# Patient Record
Sex: Male | Born: 1980
Health system: Southern US, Community
[De-identification: ages and names within clinical notes are randomized; demographics above are authoritative.]

## PROBLEM LIST (undated history)

## (undated) DIAGNOSIS — K602 Anal fissure, unspecified: Secondary | ICD-10-CM

## (undated) DIAGNOSIS — K219 Gastro-esophageal reflux disease without esophagitis: Secondary | ICD-10-CM

## (undated) HISTORY — DX: Anal fissure, unspecified: K60.2

---

## 2005-03-17 ENCOUNTER — Ambulatory Visit: Payer: Self-pay | Admitting: Internal Medicine

## 2005-09-23 ENCOUNTER — Ambulatory Visit: Payer: Self-pay | Admitting: Internal Medicine

## 2006-07-02 ENCOUNTER — Ambulatory Visit: Payer: Self-pay | Admitting: Internal Medicine

## 2006-08-07 ENCOUNTER — Ambulatory Visit: Payer: Self-pay | Admitting: Internal Medicine

## 2006-08-11 ENCOUNTER — Ambulatory Visit: Payer: Self-pay | Admitting: Internal Medicine

## 2006-10-19 ENCOUNTER — Encounter: Admission: RE | Admit: 2006-10-19 | Discharge: 2006-10-19 | Payer: Self-pay | Admitting: Internal Medicine

## 2006-10-19 ENCOUNTER — Ambulatory Visit: Payer: Self-pay | Admitting: Internal Medicine

## 2006-11-10 HISTORY — PX: EXCISIONAL HEMORRHOIDECTOMY: SHX1541

## 2007-03-05 ENCOUNTER — Ambulatory Visit: Payer: Self-pay | Admitting: Internal Medicine

## 2007-09-06 ENCOUNTER — Ambulatory Visit: Payer: Self-pay | Admitting: Internal Medicine

## 2007-09-07 ENCOUNTER — Ambulatory Visit: Payer: Self-pay | Admitting: Internal Medicine

## 2007-09-07 ENCOUNTER — Encounter: Payer: Self-pay | Admitting: Internal Medicine

## 2007-09-17 ENCOUNTER — Encounter: Payer: Self-pay | Admitting: Internal Medicine

## 2007-09-17 DIAGNOSIS — K649 Unspecified hemorrhoids: Secondary | ICD-10-CM | POA: Insufficient documentation

## 2007-09-17 DIAGNOSIS — K602 Anal fissure, unspecified: Secondary | ICD-10-CM | POA: Insufficient documentation

## 2007-11-01 ENCOUNTER — Telehealth: Payer: Self-pay | Admitting: Internal Medicine

## 2007-11-02 ENCOUNTER — Ambulatory Visit: Payer: Self-pay | Admitting: Internal Medicine

## 2007-11-11 HISTORY — PX: ANAL FISSURE REPAIR: SHX2312

## 2007-12-05 IMAGING — CR DG CHEST 2V
2 series · 2 of 2 positions shown · non-contrast
Comparison: None.

CLINICAL DATA: Cough, congestion, and fever. 
CHEST ? 2 VIEW:

[view not recorded (1 of 2)]
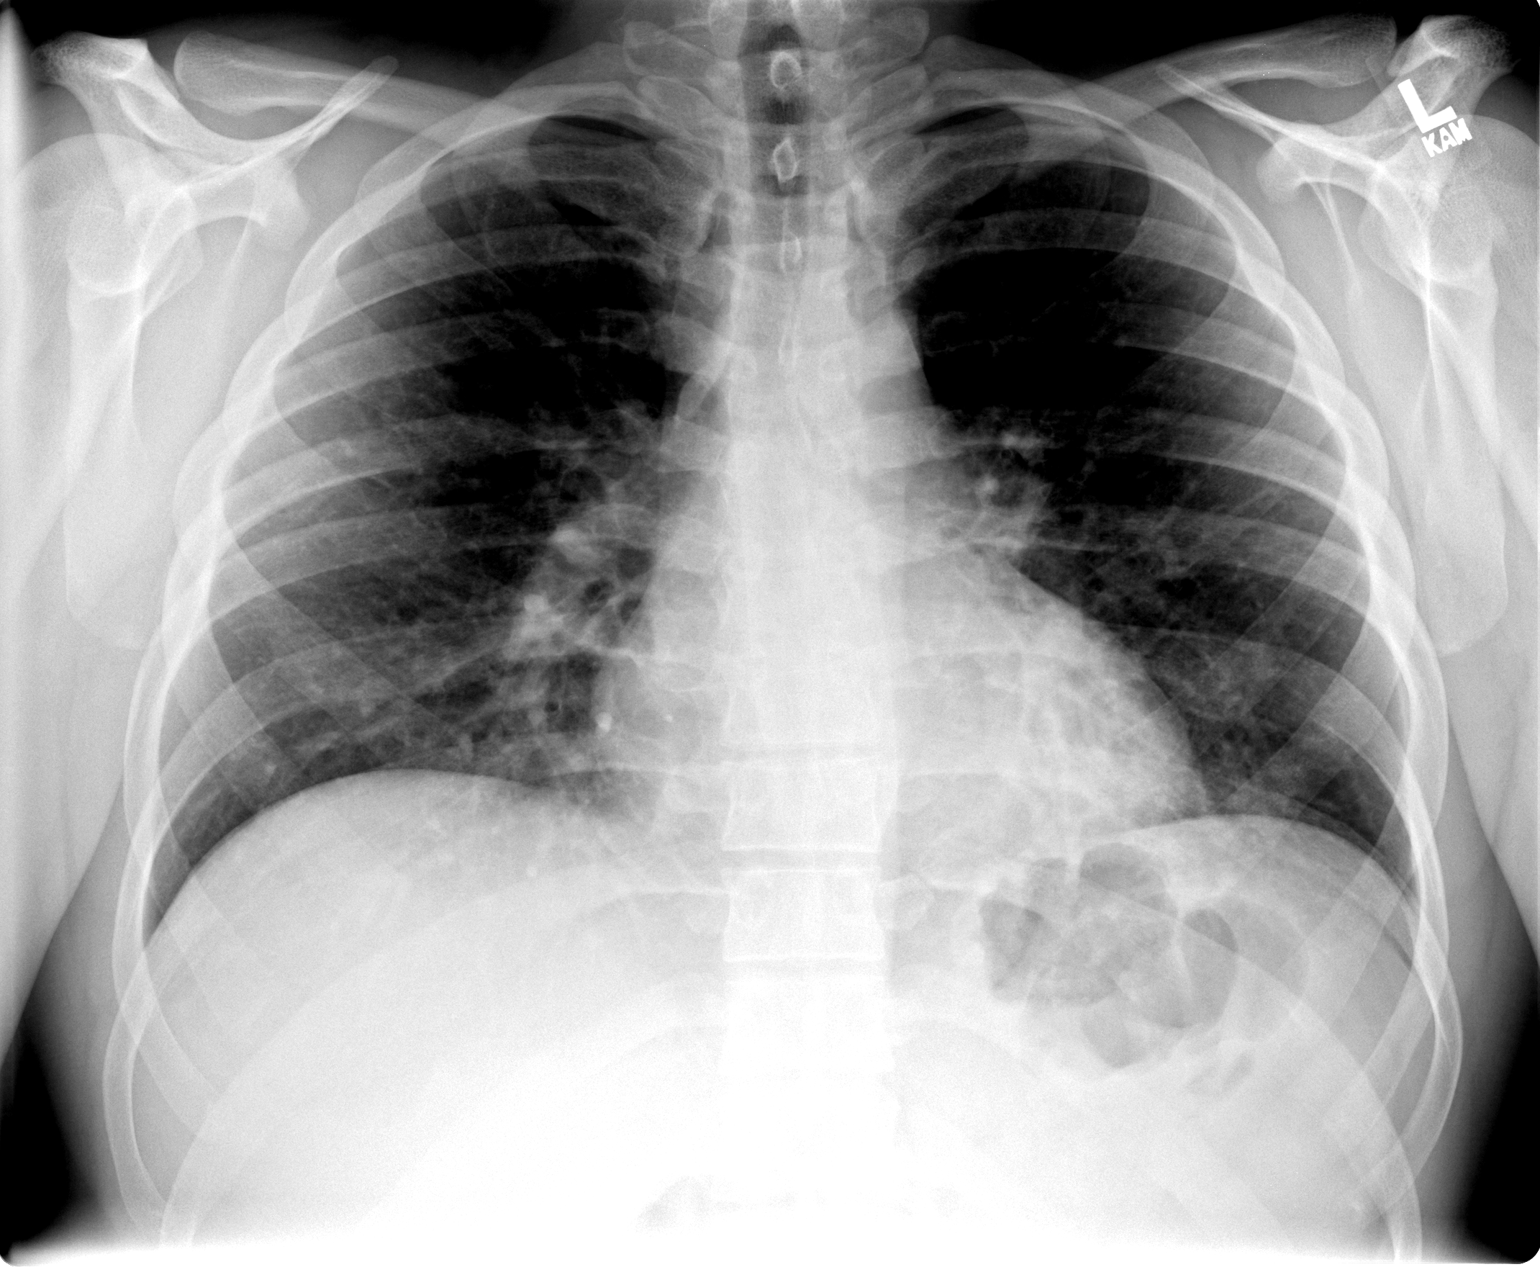

[view not recorded (2 of 2)]
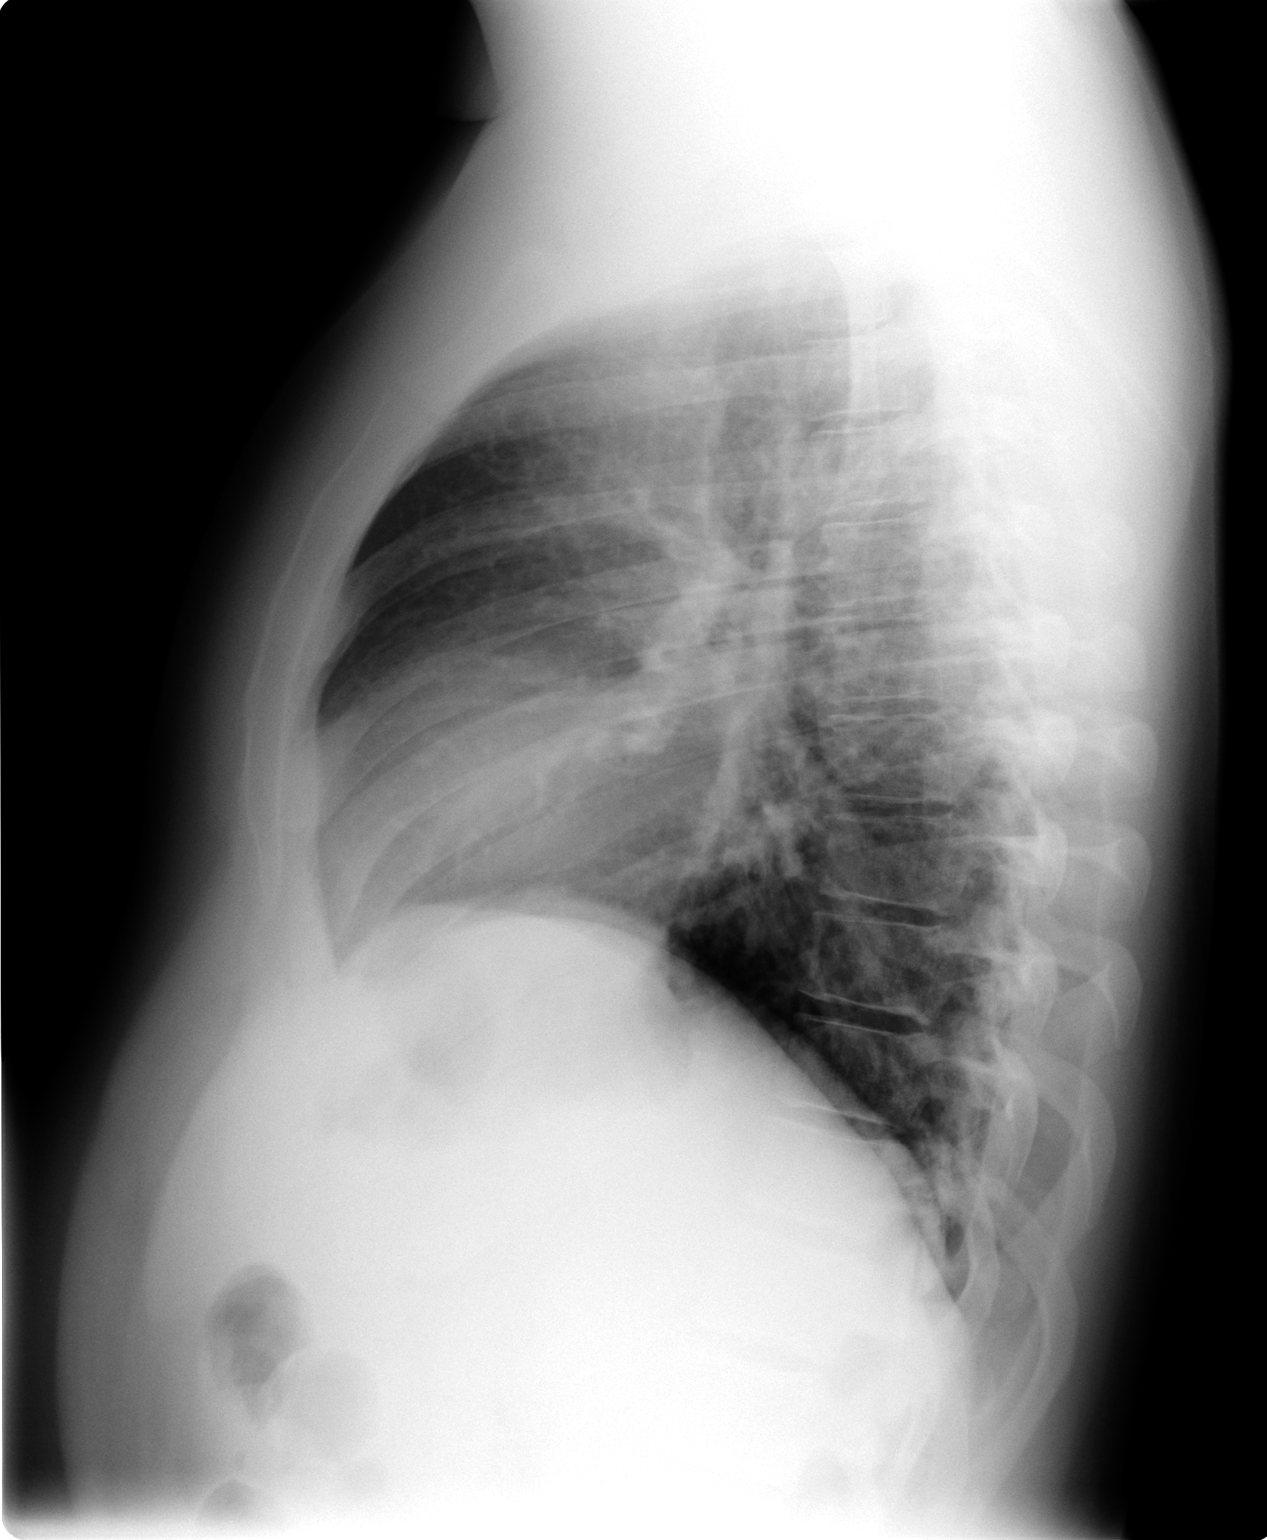

[2 of 2 positions shown; findings below may reference images not displayed]

FINDINGS: Airway thickening is present along with mild interstitial prominence in the lung bases, left greater than right.  This may reflect bronchitis or early atypical pneumonia.  No consolidation/airspace opacity identified.  No pleural effusion detected.
IMPRESSION: Central interstitial prominence and airway thickening potentially representing bronchitis or early atypical pneumonia.

## 2008-02-08 ENCOUNTER — Ambulatory Visit: Payer: Self-pay | Admitting: Internal Medicine

## 2008-02-08 DIAGNOSIS — R21 Rash and other nonspecific skin eruption: Secondary | ICD-10-CM | POA: Insufficient documentation

## 2008-02-08 DIAGNOSIS — K6289 Other specified diseases of anus and rectum: Secondary | ICD-10-CM | POA: Insufficient documentation

## 2008-02-08 LAB — CONVERTED CEMR LAB
Chlamydia, Swab/Urine, PCR: NEGATIVE
GC Probe Amp, Urine: NEGATIVE

## 2008-05-18 ENCOUNTER — Encounter: Payer: Self-pay | Admitting: Internal Medicine

## 2008-07-05 ENCOUNTER — Encounter: Payer: Self-pay | Admitting: Internal Medicine

## 2008-12-04 ENCOUNTER — Encounter: Payer: Self-pay | Admitting: Internal Medicine

## 2009-01-25 ENCOUNTER — Ambulatory Visit: Payer: Self-pay | Admitting: Internal Medicine

## 2009-01-25 DIAGNOSIS — J019 Acute sinusitis, unspecified: Secondary | ICD-10-CM | POA: Insufficient documentation

## 2009-01-25 DIAGNOSIS — T7840XA Allergy, unspecified, initial encounter: Secondary | ICD-10-CM | POA: Insufficient documentation

## 2009-01-30 ENCOUNTER — Telehealth: Payer: Self-pay | Admitting: Internal Medicine

## 2009-05-24 ENCOUNTER — Ambulatory Visit: Payer: Self-pay | Admitting: Internal Medicine

## 2009-05-24 DIAGNOSIS — S99919A Unspecified injury of unspecified ankle, initial encounter: Secondary | ICD-10-CM | POA: Insufficient documentation

## 2009-05-24 DIAGNOSIS — F17200 Nicotine dependence, unspecified, uncomplicated: Secondary | ICD-10-CM | POA: Insufficient documentation

## 2009-05-24 DIAGNOSIS — S8990XA Unspecified injury of unspecified lower leg, initial encounter: Secondary | ICD-10-CM | POA: Insufficient documentation

## 2009-05-24 DIAGNOSIS — S99929A Unspecified injury of unspecified foot, initial encounter: Secondary | ICD-10-CM

## 2009-10-01 ENCOUNTER — Encounter (INDEPENDENT_AMBULATORY_CARE_PROVIDER_SITE_OTHER): Payer: Self-pay | Admitting: *Deleted

## 2009-10-01 ENCOUNTER — Telehealth: Payer: Self-pay | Admitting: Internal Medicine

## 2009-10-29 ENCOUNTER — Ambulatory Visit: Payer: Self-pay | Admitting: Gastroenterology

## 2009-11-21 ENCOUNTER — Ambulatory Visit: Payer: Self-pay | Admitting: Gastroenterology

## 2009-12-10 ENCOUNTER — Encounter: Payer: Self-pay | Admitting: Internal Medicine

## 2009-12-13 ENCOUNTER — Encounter: Payer: Self-pay | Admitting: Internal Medicine

## 2009-12-26 ENCOUNTER — Encounter: Payer: Self-pay | Admitting: Internal Medicine

## 2010-02-07 ENCOUNTER — Encounter: Payer: Self-pay | Admitting: Internal Medicine

## 2010-07-10 IMAGING — CR DG FOOT COMPLETE 3+V*R*
3 series · 3 of 3 positions shown · non-contrast
Comparison: None.

CLINICAL DATA: 28-year-old male status post blunt trauma to the
right great toe with pain and swelling.

RIGHT FOOT COMPLETE - 3+ VIEW

[view not recorded (1 of 3)]
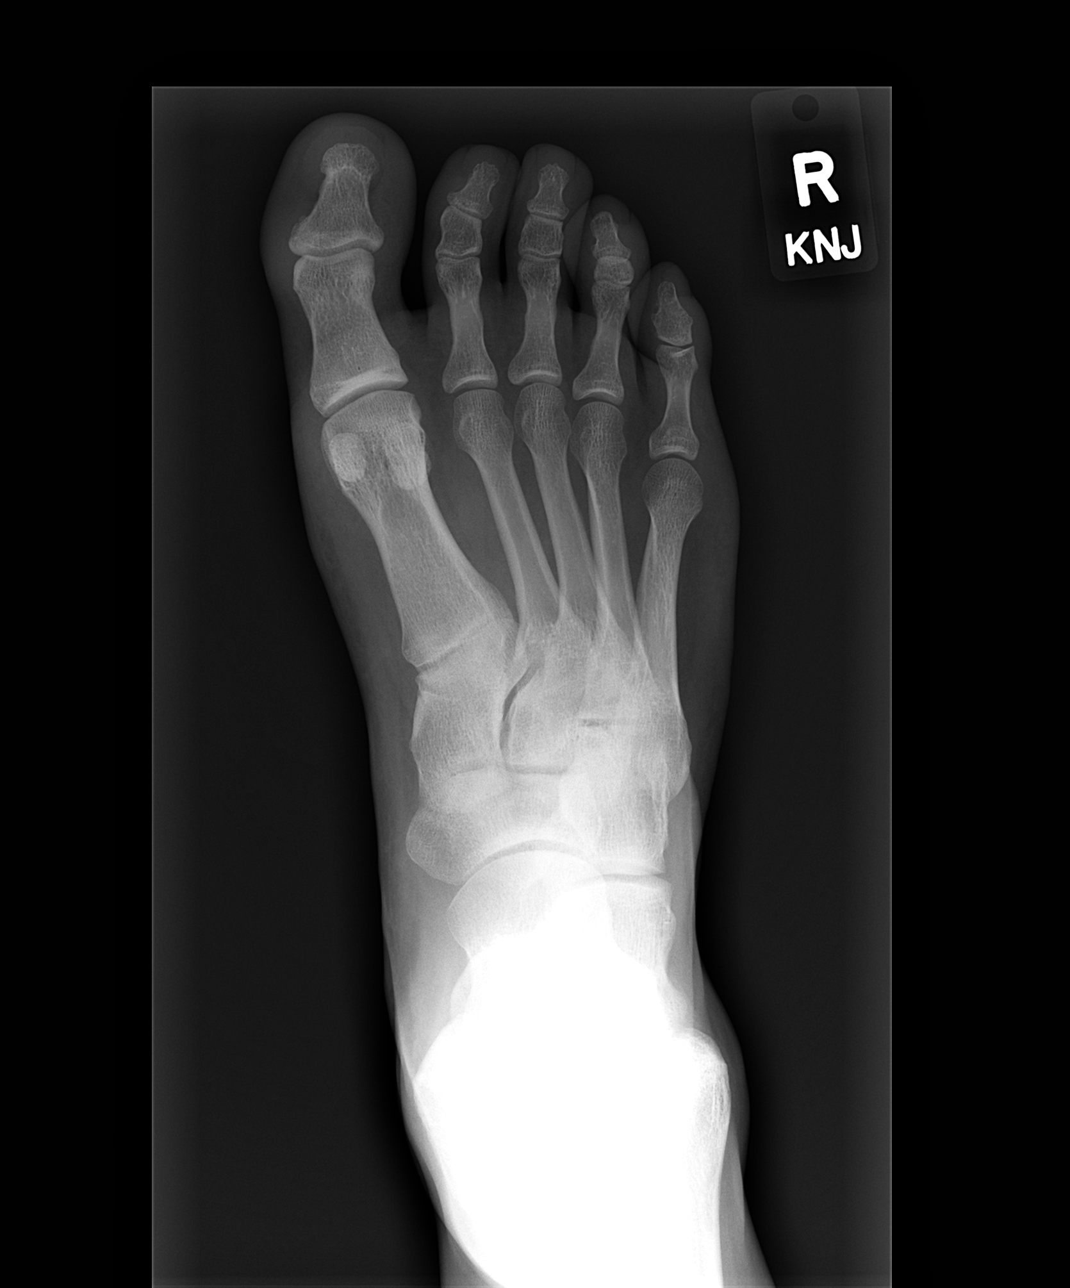

[view not recorded (2 of 3)]
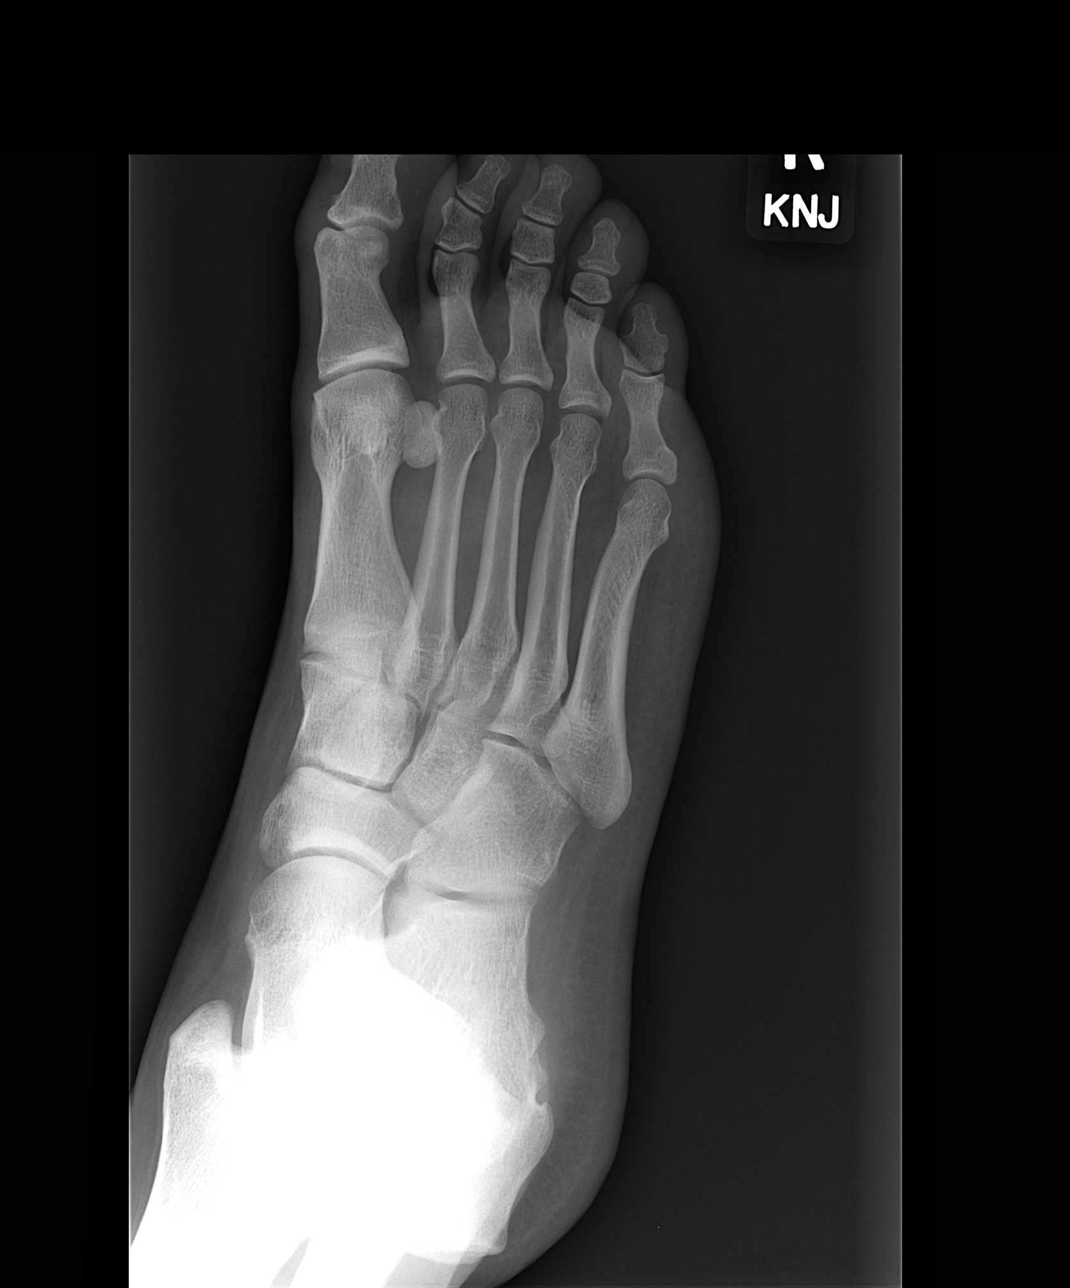

[view not recorded (3 of 3)]
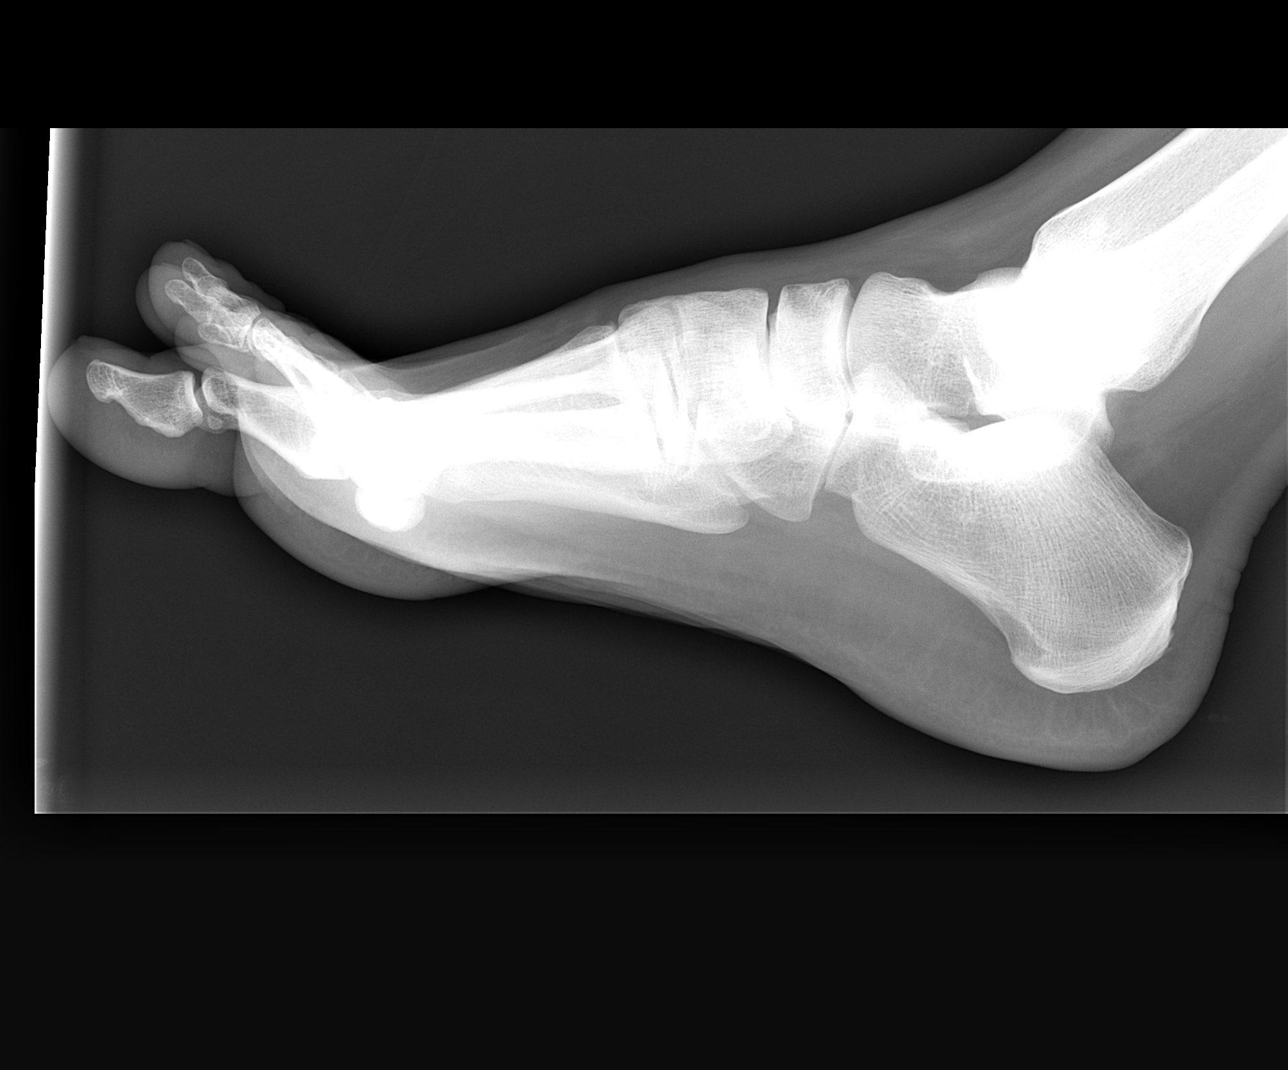

[3 of 3 positions shown; findings below may reference images not displayed]

FINDINGS: There is mild cortical irregularity at the medial base of
the right first distal phalanx.  No definite intra-articular
extension.  There is soft tissue swelling in the region.  Joint
spaces are preserved.  Elsewhere bone mineralization is within
normal limits.  The calcaneus intact.  No other acute fractures
identified.
IMPRESSION: Nondisplaced fracture at the medial base of the right first distal
phalanx.

## 2010-07-22 ENCOUNTER — Encounter: Payer: Self-pay | Admitting: Internal Medicine

## 2010-08-13 ENCOUNTER — Encounter: Payer: Self-pay | Admitting: Internal Medicine

## 2010-08-13 ENCOUNTER — Ambulatory Visit: Payer: Self-pay | Admitting: Internal Medicine

## 2010-08-13 DIAGNOSIS — R0789 Other chest pain: Secondary | ICD-10-CM | POA: Insufficient documentation

## 2010-12-10 NOTE — Letter (Signed)
Summary: [facility]   Imported By: Sherian Rein 12/25/2009 08:36:59  _____________________________________________________________________  External Attachment:    Type:   Image     Comment:   External Document

## 2010-12-10 NOTE — Letter (Signed)
Summary: [facility]   Imported By: Sherian Rein 02/26/2010 09:46:29  _____________________________________________________________________  External Attachment:    Type:   Image     Comment:   External Document

## 2010-12-10 NOTE — Procedures (Signed)
Summary: Flexible Sigmoidoscopy  Patient: Tyler Griffin Note: All result statuses are Final unless otherwise noted.  Tests: (1) Flexible Sigmoidoscopy (FLX)  FLX Flexible Sigmoidoscopy                             DONE     [facility]     [address]     [place], [place]  [address]           FLEXIBLE SIGMOIDOSCOPY PROCEDURE REPORT     PATIENT:  Tyler Griffin, Tyler Griffin  MR#:  [id number]     BIRTHDATE:  02/16/1981, 28 yrs. old  GENDER:  male     ENDOSCOPIST:  Rachael Fee, MD     Referred by:  Rosalyn Gess. Norins, M.D.     PROCEDURE DATE:  11/21/2009     PROCEDURE:  Flexible Sigmoidoscopy, diagnostic     ASA CLASS:  Class I     INDICATIONS:  h/o hemorrhoids, fissures, ?polyp, intermittent     rectal bleeding, constipation     MEDICATIONS:   Fentanyl 50 mcg IV, Versed 5 mg IV     DESCRIPTION OF PROCEDURE:   After the risks benefits and     alternatives of the procedure were thoroughly explained, informed     consent was obtained.  Digital rectal exam was performed and     revealed no rectal masses.   The [id number] endoscope     was introduced through the anus and advanced to the mid transverse     colon, without limitations.  The quality of the prep was     excellent.  The instrument was then slowly withdrawn as the mucosa     was fully examined.     <<PROCEDUREIMAGES>>           There was a finger-like 2cm long nodule that communicates with the     proximal anal canal. The mucosa on the nodule was not clearly     adenomatous appearing (see image3 and image4).  External     hemorrhoids were found. These were small and not thrombosed.  The     examination was otherwise normal (see image1 and image2).     Retroflexed views in the rectum revealed no abnormalities.    The     scope was then withdrawn from the patient and the procedure     terminated.           COMPLICATIONS:  None           ENDOSCOPIC IMPRESSION:     1) 1-2cm proximal anus finger-like nodularity.  Not  biopsied.     2) Small external hemorrhoids     3) Otherwise normal examination.           RECOMMENDATIONS:     The anal canal "nodule" is likely causing some of your anal     symptoms (bleeding, discomfort). Dr. Christella Hartigan office will arrange     for you to get back in to see Dr. Avel Peace about resection.           You should continue one to two scoops of miralax a day to help     with chronic constipation symptoms.           ______________________________     Rachael Fee, MD           cc: Avel Peace, MD  n.     eSIGNED:   Rachael Fee at 11/21/2009 01:52 PM           Trenton Gammon, [id number]  Note: An exclamation mark (!) indicates a result that was not dispersed into the flowsheet. Document Creation Date: 11/21/2009 1:52 PM _______________________________________________________________________  (1) Order result status: Final Collection or observation date-time: 11/21/2009 13:38 Requested date-time:  Receipt date-time:  Reported date-time:  Referring Physician:   Ordering Physician: Rob Bunting [phone]) Specimen Source:  Source: Launa Grill Order Number: [id number] Lab site:   Appended Document: Orders Update/CCS    Clinical Lists Changes  Orders: Added new Test order of [facility] ([facility]) - Signed

## 2010-12-10 NOTE — Assessment & Plan Note (Signed)
Summary: upper chest pain since yesterday/cd   Vital Signs:  Patient profile:   30 year old male Height:      72 inches Weight:      226 pounds BMI:     30.76 O2 Sat:      97 % on Room air Temp:     98.0 degrees F oral Pulse rate:   58 / minute BP sitting:   128 / 74  (left arm) Cuff size:   regular  Vitals Entered By: Bill Salinas CMA (August 13, 2010 9:21 AM)  O2 Flow:  Room air CC: pt here with c/o chest tightness x 1 day/ ab   Primary Care Provider:  Illene Regulus, MD   CC:  pt here with c/o chest tightness x 1 day/ ab.  History of Present Illness: Tyler Griffin is a 30 y.o. caucasian male who presents with a one day history of mid/substernal chest pain. He first noticed onset of Sx at noon on 03OCT2011 while at his work. He describes the pain as tight and sore, as if he had lifted weights. He does not complain of pain radiating laterally toward either arm or posteriorly toward his back. On 02OCT2011 Tyler Griffin experienced heartburn due to pizza he consumed. He and his wife were concerned whether or not the chest pain he experienced on 03OCT2011 was cuased by the pizza. Tyler Griffin has seasonal allergies and has been taking Claritin D since the weather has become cooler. He works at an [employer] and described his job as "extremely stressful, especially this time of year."  Current Medications (verified): 1)  Claritin-D 24 Hour 10-240 Mg Xr24h-Tab (Loratadine-Pseudoephedrine) .... Take 1 By Mouth Qd 2)  Multivitamins  Tabs (Multiple Vitamin) .... Take 1 By Mouth Qd 3)  Miralax  Powd (Polyethylene Glycol 3350) .... 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  The patient denies fever, vision loss, syncope, dyspnea on exertion, hematuria, incontinence, and difficulty walking.         Tyler Griffin wanted to know if the food he ate caused him to have heartburn.  Physical Exam  General:  alert, well-developed, well-nourished, appropriate dress, normal appearance, cooperative to examination, and good hygiene.   Eyes:  vision grossly intact and no injection.   Nose:  no external deformity.   Chest Wall:  no deformities, no tenderness, and no masses.   Lungs:  normal respiratory effort, no intercostal retractions, and no accessory muscle use.   Heart:  normal rate, regular rhythm, no murmur, no heaves, no thrills, and PMI normal.     Impression & Recommendations:  Problem # 1:  CHEST PAIN, ATYPICAL (ICD-786.59)  Patient with atypical chest pain unlikely to be of cardiac origin. 12 lead EKG was reviewed: normal study without signs of ischemia. He has no limitations in activity.   Plan - Tyler Griffin was advised to purchase OTC liquid anti-acid for immediate relief.           He was also advised to purchase OTC H2 blocker for long term use (3 weeks  Finally, Tyler Griffin was educated about the symptoms of angina, and excersise intolerance  Orders: EKG w/ Interpretation (93000)  Problem # 2:  CIGARETTE SMOKER (ICD-305.1)  Patient has relapsed after cessation 1 year ago. He did use chantix at that without serious adverse side effects except for mildly vivid dreams and a little bit of emotional change. He is interested in smoking cessation. Discussed the psychological dependence as well as physical   Plan - smoking cessation using chantix starter pak and then monthly pak for 4-6 months           telephone follow-up in one month  His updated medication list for this problem includes:    Chantix Starting Month Pak  0.5 Mg X 11 & 1 Mg X 42 Tabs (Varenicline tartrate) ..... Use a s directed    Chantix Continuing Month Pak 1 Mg Tabs (Varenicline tartrate) .Marland Kitchen... 1 by mouth once daily  Orders: Tobacco use cessation intermediate 3-10 minutes (99406)  Complete Medication List: 1)  Claritin-d 24 Hour 10-240 Mg Xr24h-tab (Loratadine-pseudoephedrine) .... Take 1 by mouth qd 2)  Multivitamins Tabs (Multiple vitamin) .... Take 1 by mouth qd 3)  Miralax Powd (Polyethylene glycol 3350) .... Daily 4)  Chantix Starting Month Pak 0.5 Mg X 11 & 1 Mg X 42 Tabs (Varenicline tartrate) .... Use a s directed 5)  Chantix Continuing Month Pak 1 Mg Tabs (Varenicline tartrate) .Marland Kitchen.. 1 by mouth once daily Prescriptions: CHANTIX CONTINUING MONTH PAK 1 MG TABS (VARENICLINE TARTRATE) 1 by mouth once daily  #1 x 5   Entered and Authorized by:   Jacques Navy MD   Signed by:   Jacques Navy MD on 08/13/2010   Method used:   Electronically to        [facility]. [phone]* (retail)       [address]       [place], [place]  [address]       Ph: [phone]       Fax: [phone]   RxID:   [id number] CHANTIX STARTING MONTH PAK 0.5 MG X 11 & 1 MG X 42 TABS (VARENICLINE TARTRATE) use a s directed  #1 x 0   Entered and Authorized by:   Jacques Navy MD   Signed by:   Jacques Navy MD on 08/13/2010   Method used:   Electronically to        [facility]. [phone]* (retail)       [address]       [place], [place]  [address]       Ph: [phone]       Fax: [phone]   RxID:   [id number]

## 2010-12-10 NOTE — Op Note (Signed)
Summary: [facility]   Imported By: Sherian Rein 12/19/2009 13:49:52  _____________________________________________________________________  External Attachment:    Type:   Image     Comment:   External Document

## 2010-12-10 NOTE — Letter (Signed)
Summary: [facility]   Imported By: Lester Golden 01/07/2010 07:28:19  _____________________________________________________________________  External Attachment:    Type:   Image     Comment:   External Document

## 2011-01-01 NOTE — Letter (Signed)
Summary: [facility]   Imported By: Sherian Rein 12/25/2010 08:05:09  _____________________________________________________________________  External Attachment:    Type:   Image     Comment:   External Document

## 2011-01-10 ENCOUNTER — Ambulatory Visit (INDEPENDENT_AMBULATORY_CARE_PROVIDER_SITE_OTHER): Payer: BC Managed Care – PPO | Admitting: Internal Medicine

## 2011-01-10 ENCOUNTER — Encounter: Payer: Self-pay | Admitting: Internal Medicine

## 2011-01-10 DIAGNOSIS — M79609 Pain in unspecified limb: Secondary | ICD-10-CM

## 2011-01-14 ENCOUNTER — Telehealth: Payer: Self-pay | Admitting: Internal Medicine

## 2011-01-16 ENCOUNTER — Encounter: Payer: Self-pay | Admitting: Internal Medicine

## 2011-01-16 NOTE — Assessment & Plan Note (Signed)
Summary: ?torn a tendon in left hand near thumb/lb   Vital Signs:  Patient profile:   30 year old male Height:      72 inches Weight:      236 pounds BMI:     32.12 O2 Sat:      97 % on Room air Temp:     98.3 degrees F oral Pulse rate:   58 / minute BP sitting:   122 / 82  (left arm) Cuff size:   regular  Vitals Entered By: Bill Salinas CMA (January 10, 2011 9:47 AM)  O2 Flow:  Room air CC: pt c/o loss of strength in left hand and states symptoms started about 4 months ago/ ab   Primary Care Provider:  Illene Regulus, MD   CC:  pt c/o loss of strength in left hand and states symptoms started about 4 months ago/ ab.  History of Present Illness: Patient presents with fouir month h/o weakness in the left thumb and inability to fully adduct the thumb. He has a very active 60lb dog and uses the left hand on the leash. He may have had repetitive stress from walking the dog. He has not pain and he has normal sensation.  On chantix - having some sleep issues and only one or two bad dreams.   Current Medications (verified): 1)  Multivitamins  Tabs (Multiple Vitamin) .... Take 1 By Mouth Qd 2)  Miralax  Powd (Polyethylene Glycol 3350) .... Daily 3)  Chantix Starting Month Pak 0.5 Mg X 11 & 1 Mg X 42 Tabs (Varenicline Tartrate) .... Use A S Directed 4)  Chantix Continuing Month Pak 1 Mg Tabs (Varenicline Tartrate) .Marland Kitchen.. 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    Physical Exam  General:  Well-developed,well-nourished,in no acute distress; alert,appropriate and cooperative throughout examination Head:  Normocephalic and atraumatic without obvious abnormalities. No apparent alopecia or balding. Eyes:  C&S clear Lungs:  normal respiratory effort.   Heart:  normal rate and regular rhythm.   Msk:  left thumb- no pain at the MCP joint. There is limited adduction to 60% of normal. Weakness with the "O" sign.. normal sensation.  Pulses:  2+ radial pulse and good capilary Neurologic:  alert & oriented X3, cranial nerves II-XII intact, and gait normal.   Skin:  turgor normal, color normal, and no rashes.   Cervical Nodes:  no anterior cervical adenopathy and no posterior cervical adenopathy.   Psych:  Oriented X3, good eye contact, and not anxious appearing.     Impression & Recommendations:  Problem # 1:  THUMB PAIN, LEFT (ICD-729.5) Suspect tendon trauma to adductors left thumb without complete avulsion. Definitely has limitation in range of motion and strength.  Plan - [facility].  Orders: Orthopedic Surgeon Referral (Ortho Surgeon)  Problem # 2:  CIGARETTE SMOKER (ICD-305.1) 3 months abstemious. Still using chantix and it  helps. However, having some sleep disruption.  Plan - continue chantix for 6-12 months          for sleep - trial of diphenhydramine 25mg , if not successfull - zolpidem 10mg   His updated medication list for this problem includes:    Chantix Starting Month Pak 0.5 Mg X 11 & 1 Mg X 42 Tabs (Varenicline tartrate) ..... Use a s directed    Chantix Continuing Month Pak 1 Mg Tabs (Varenicline tartrate) .Marland Kitchen... 1 by mouth once daily  Complete Medication List: 1)  Multivitamins Tabs (Multiple vitamin) .... Take 1 by  mouth qd 2)  Miralax Powd (Polyethylene glycol 3350) .... Daily 3)  Chantix Starting Month Pak 0.5 Mg X 11 & 1 Mg X 42 Tabs (Varenicline tartrate) .... Use a s directed 4)  Chantix Continuing Month Pak 1 Mg Tabs (Varenicline tartrate) .Marland Kitchen.. 1 by mouth once daily   Orders Added: 1)  Orthopedic Surgeon Referral [Ortho Surgeon] 2)  Est. Patient Level III [[address]]

## 2011-01-21 NOTE — Progress Notes (Signed)
Summary: RX for sleep  Phone Note Call from Patient Call back at [facility] Phone [phone]   Summary of Call: Pt tired benadryl w/no relief for his sleep. PT has tried [place] in the past and would like rx from MD. Initial call taken by: Lamar Sprinkles, CMA,  January 14, 2011 11:16 AM  Follow-up for Phone Call        ok for ambien 10mg  at bedtime, #30, refill x 5 Follow-up by: Jacques Navy MD,  January 14, 2011 5:23 PM  Additional Follow-up for Phone Call Additional follow up Details #1::        left detailed vm on pt's hm # Additional Follow-up by: Lamar Sprinkles, CMA,  January 14, 2011 5:44 PM    New/Updated Medications: ZOLPIDEM TARTRATE 10 MG TABS (ZOLPIDEM TARTRATE) 1 by mouth at bedtime as needed Prescriptions: ZOLPIDEM TARTRATE 10 MG TABS (ZOLPIDEM TARTRATE) 1 by mouth at bedtime as needed  #30 x 5   Entered by:   Lamar Sprinkles, CMA   Authorized by:   Jacques Navy MD   Signed by:   Lamar Sprinkles, CMA on 01/14/2011   Method used:   Telephoned to ...       [facility]  [place]. [phone]* (retail)       [address]       [place], [place]  [address]       Ph: [phone]       Fax: [phone]   RxID:   [id number]

## 2011-02-03 ENCOUNTER — Ambulatory Visit (INDEPENDENT_AMBULATORY_CARE_PROVIDER_SITE_OTHER): Payer: BC Managed Care – PPO | Admitting: Internal Medicine

## 2011-02-03 VITALS — BP 98/68 | HR 65 | Temp 98.7°F | Resp 14 | Wt 234.5 lb

## 2011-02-03 DIAGNOSIS — M545 Low back pain, unspecified: Secondary | ICD-10-CM

## 2011-02-04 DIAGNOSIS — M545 Low back pain, unspecified: Secondary | ICD-10-CM | POA: Insufficient documentation

## 2011-02-04 NOTE — Progress Notes (Signed)
  Subjective:    Patient ID: Tyler Griffin, male    DOB: July 14, 1981, 30 y.o.   MRN: [id number]  HPI Tyler Griffin reports good results from injection of left thmb by Dr. Merlyn Lot for hyper-extension injury.  He presents reporting that he has been having low back pain with no exacerbating event noted. Massage helps his discomfort. He has no radicular symptoms. He also has pin in the left scapular and left neck area, again with no radicular symptoms  PMH, FMH, SOCHx - reviewed for relevance  Review of Systems  Constitutional: Negative.  Negative for fever, chills, activity change and unexpected weight change.  HENT: Negative.  Negative for hearing loss, ear pain, congestion, neck stiffness and postnasal drip.   Eyes: Negative.  Negative for pain, discharge and visual disturbance.  Respiratory: Negative.  Negative for chest tightness and wheezing.   Cardiovascular: Negative.  Negative for chest pain and palpitations.       [No decreased exercise tolerance Gastrointestinal: Negative.        [No change in bowel habit. No bloating or gas. No reflux or indigestion Genitourinary: Negative.  Negative for urgency, frequency, flank pain and difficulty urinating.  Musculoskeletal: Negative.  Negative for myalgias, back pain, arthralgias and gait problem.  Neurological: Negative.  Negative for dizziness, tremors, weakness and headaches.  Hematological: Negative.  Negative for adenopathy.  Psychiatric/Behavioral: Negative for behavioral problems and dysphoric mood.  [all other systems reviewed and are negative     Review of Systems     Objective:   Physical Exam  Vitals reviewed. Constitutional: He is oriented to person, place, and time. He appears well-developed and well-nourished.  Neck: Normal range of motion. Neck supple.  Cardiovascular: Normal rate and regular rhythm.   Pulmonary/Chest: Effort normal.  Musculoskeletal:       Normal movement, stand w/o assistance, nl gait. Full ROM left  shoulder. Nl strength.  Neurological: He is alert and oriented to person, place, and time.  Skin: Skin is warm and dry.  Psychiatric: He has a normal mood and affect.          Assessment & Plan:  1. Back pain - most c/w muscle strain and ergonomics  Plan - patient advised to insure good back support at work, use foot rest - one foot at a time elevated. Referred to YouTube for back exercise and stretches to be done on a routine basis. OK to use periodic NSAIDs  2. Shoulder pain - also c/w muscle tightness  Plan - exercise, stretch, myofascial release using tennis ball at rhomboids and pectoralis muscle groups.

## 2011-02-06 NOTE — Letter (Signed)
[facility]   Imported By: Lennie Odor 01/27/2011 15:48:46  _____________________________________________________________________  External Attachment:    Type:   Image     Comment:   External Document

## 2011-03-28 NOTE — Assessment & Plan Note (Signed)
[facility]                           PRIMARY CARE OFFICE NOTE   LONDEN, BOK                        MRN:          [id number]  DATE:03/05/2007                            DOB:          1981-01-02    [name] is a 30 year old Caucasian gentleman, one of my patients, who  presents to establish for ongoing continuity care.   CONCERNS:  1. GI:  Patient had a recent anal fissure and hemorrhoid that was      treated at urgent care with a hydrocortisone-containing product      with resolution of his symptoms.  He reports at urgent care he had      full routine labs, and he is going to obtain copies for me.  2. Concern for family history of colon cancer.  3. Episodic erythematous small lesions about the umbilicus.   PAST MEDICAL HISTORY:  1. Surgical none.  2. Trauma none.  3. Medical:  Patient had chickenpox and otherwise was fully immunized.  4. History of hemorrhoids and anal fissure on several occasions.   CURRENT MEDICATIONS:  1. Claritin OTC.  2. Multivitamins.  3. Exercise supplements.   FAMILY HISTORY:  Patient indicated positive for either alcohol or drug  problems in parents or grandparents, diabetes in grandparents, coronary  artery disease in his father, who had triple bypass surgery.   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  Currently working as an Conservator, museum/gallery.  Patient reports he  had a high school relationship, college and late-college relationship  but currently is not in a relationship and has been abstinent for a  year.  In the past he has used condoms with sexual intercourse.  Patient  reports he exercises for one hour five days a week.   HABITS:  Tobacco one-half pack per day for five years.  Alcohol:  Averaging 16 ounces per month.  No recreational drug use.   REVIEW OF SYSTEMS:  Patient has had no fevers, sweats, chills, insomnia,  or other constitutional problems.  He  has had an eye exam in the last  five years.  No ENT, cardiovascular, or respiratory problems.  He has  occasional heartburn treated with Tums.  Hemorrhoids as noted above.  No  GU or musculoskeletal complaints.   PHYSICAL EXAMINATION:  VITAL SIGNS:  Temperature was 98, blood pressure  113/66, pulse 66.  Weight 257.  Height 6 feet 1.  GENERAL APPEARANCE:  This is a heavy-set Caucasian male in no acute  distress.  HEENT:  Normocephalic and atraumatic.  EACs and TM's were normal.  Oropharynx with native dentition in good repair.  No buccal or palatal  lesions were noted.  Post pharynx was clear.  Sclerae and conjunctivae  are clear.  PERRLA.  Funduscopic exam was unremarkable.  NECK:  Supple without thyromegaly.  NODES:  No adenopathy was noted in the cervical or supraclavicular  regions.  CHEST:  No CVA tenderness.  LUNGS:  Clear to auscultation and percussion.  CARDIOVASCULAR:  Radial pulses 2+.  No JVD, no carotid  bruits.  He had a  quiet precordium with a regular rate and rhythm without murmurs, rubs or  gallops.  ABDOMEN:  Soft.  No rebound or guarding.  No organosplenomegaly was  noted.  GENITALIA:  Normal male phallus, bilaterally descended testicles without  masses.  No sign of inguinal hernia on examination.  EXTREMITIES:  Without cyanosis, clubbing, edema, or deformity.  NEUROLOGIC:  Grossly nonfocal.  DERM:  Patient has mildly erythematous, pinpoint lesions just around and  below the umbilicus.   ASSESSMENT/PLAN:  1. Dermatologic:  Patient with probable mild folliculitis.  I have      advised him to use an antibacterial soap and either a body brush or      washcloth for brisk cleaning.  He should cover with a Band-Aid any      evolving lesion to avoid irritation.  2. Colorectal cancer screening:  Patient with a positive family      history for colon cancer, by his report.  At this point, would      recommend Hemoccult cards, which were provided to the patient.       Would not recommend colonoscopy until age 69 or if he develops      other symptoms.  3. Gastrointestinal:  Patient with a history of hemorrhoids and anal      fissure.  Currently, he is doing well.  I have encouraged him to      continue to use a bulk laxative and avoid straining at stool.  4. Tobacco abuse:  I have discussed this with the patient in regards      to his significant risk to his healthy.  We discussed the process      of addiction and the difficulties in cessation.  I have adamantly      encouraged him to consider smoking cessation and have referred him      to the 1-800-QUITNOW number for assistance.  Would be happy to      provide prescriptions if needed.  5. Health maintenance:  Laboratories are to be forwarded to me.  I      have advised the patient he needs to have a general routine      physical exam every five years until age 49.   PLAN:  I have oriented the patient to our practice, our ability to see  patients on a work-in basis, our after-hours and Saturday care.  Furthermore, I have informed him that he can communicate with me by E-  mail for nonemergent, nonurgent issues.   In summary, this is a pleasant gentleman who seems to be medically  stable at this time.  Will review laboratory when available and make  additional recommendations as indicated.     Rosalyn Gess Norins, MD  Electronically Signed    MEN/MedQ  DD: 03/05/2007  DT: 03/06/2007  Job #: [id number]   cc:   Trenton Gammon

## 2011-04-10 ENCOUNTER — Encounter (INDEPENDENT_AMBULATORY_CARE_PROVIDER_SITE_OTHER): Payer: Self-pay | Admitting: General Surgery

## 2011-04-28 ENCOUNTER — Encounter (INDEPENDENT_AMBULATORY_CARE_PROVIDER_SITE_OTHER): Payer: Self-pay | Admitting: General Surgery

## 2011-07-16 ENCOUNTER — Other Ambulatory Visit: Payer: Self-pay | Admitting: Internal Medicine

## 2011-07-16 NOTE — Telephone Encounter (Signed)
Please Advise refill 

## 2011-07-17 NOTE — Telephone Encounter (Signed)
Ok for refill x 5 

## 2011-07-21 ENCOUNTER — Encounter (INDEPENDENT_AMBULATORY_CARE_PROVIDER_SITE_OTHER): Payer: Self-pay | Admitting: General Surgery

## 2011-07-21 ENCOUNTER — Ambulatory Visit (INDEPENDENT_AMBULATORY_CARE_PROVIDER_SITE_OTHER): Payer: BC Managed Care – PPO | Admitting: General Surgery

## 2011-07-21 VITALS — BP 108/72 | HR 60

## 2011-07-21 DIAGNOSIS — A630 Anogenital (venereal) warts: Secondary | ICD-10-CM | POA: Insufficient documentation

## 2011-07-21 NOTE — Progress Notes (Signed)
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 He has noticed some intermittent bleeding at times. No pain. He no longer needs to use MiraLax. His constipation has resolved.  Exam: Gen.-well-developed, well-nourished, in no acute distress.  Anal rectal-there is a posterior skin tag present; posterior to this, there is a condyloma-like lesion that is friable and bleeds easily; no fissure. No mass or irregularity on digital rectal exam.  Anoscopy-no anal lesions noted.  Treatment-Podophyllin applied to condyloma this lesion posteriorly.  Assessment: Anal condyloma with suspicious posterior lesion. Chemical  Ablation applied.  Plan-remove chemical in 4 hours. Return visit 4 weeks.

## 2011-07-21 NOTE — Patient Instructions (Signed)
Wash area off in 4 hours or sooner if you are having a lot of pain.

## 2011-08-18 ENCOUNTER — Ambulatory Visit (INDEPENDENT_AMBULATORY_CARE_PROVIDER_SITE_OTHER): Payer: BC Managed Care – PPO | Admitting: General Surgery

## 2011-08-18 ENCOUNTER — Encounter (INDEPENDENT_AMBULATORY_CARE_PROVIDER_SITE_OTHER): Payer: Self-pay | Admitting: General Surgery

## 2011-08-18 VITALS — BP 126/82 | HR 80 | Temp 97.0°F | Resp 20 | Ht 72.0 in | Wt 242.4 lb

## 2011-08-18 DIAGNOSIS — A630 Anogenital (venereal) warts: Secondary | ICD-10-CM

## 2011-08-18 NOTE — Progress Notes (Signed)
Mr. 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 He has noticed some intermittent bleeding at times. No pain. He no longer needs to use MiraLax. His constipation has resolved.  Exam: Gen.-well-developed, well-nourished, in no acute distress.  Anal rectal-there is a posterior skin tag present; the condyloma is no longer present.  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  Plan:  Return visit in 3 months.

## 2011-11-17 ENCOUNTER — Ambulatory Visit (INDEPENDENT_AMBULATORY_CARE_PROVIDER_SITE_OTHER): Payer: BC Managed Care – PPO | Admitting: General Surgery

## 2011-11-17 ENCOUNTER — Encounter (INDEPENDENT_AMBULATORY_CARE_PROVIDER_SITE_OTHER): Payer: Self-pay | Admitting: General Surgery

## 2011-11-17 VITALS — BP 136/90 | HR 76 | Temp 96.8°F | Resp 12 | Ht 72.0 in | Wt 250.4 lb

## 2011-11-17 DIAGNOSIS — A630 Anogenital (venereal) warts: Secondary | ICD-10-CM

## 2011-11-17 NOTE — Patient Instructions (Signed)
Do self inspection every month.

## 2011-11-17 NOTE — Progress Notes (Signed)
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 He has not had any anal problems since his last visit.  Exam: Gen.-well-developed, well-nourished, in no acute distress.  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  Plan:  Return visit in 6 months.  Monthly self inspections.

## 2012-01-12 ENCOUNTER — Other Ambulatory Visit: Payer: Self-pay | Admitting: *Deleted

## 2012-01-12 NOTE — Telephone Encounter (Signed)
Zolpidem request [last refill 09.05.12 #[id number]  Last OV 03.02.12]

## 2012-01-12 NOTE — Telephone Encounter (Signed)
OK for refill x 5. If sleep is still a big problem may want to have follow-up OV, his option.

## 2012-01-13 MED ORDER — ZOLPIDEM TARTRATE 10 MG PO TABS: ORAL_TABLET | ORAL | Status: DC

## 2012-01-13 NOTE — Telephone Encounter (Signed)
Done; [facility] for patient to inform.

## 2012-04-12 ENCOUNTER — Encounter (INDEPENDENT_AMBULATORY_CARE_PROVIDER_SITE_OTHER): Payer: Self-pay | Admitting: General Surgery

## 2012-05-18 ENCOUNTER — Other Ambulatory Visit: Payer: Self-pay | Admitting: Internal Medicine

## 2013-03-25 ENCOUNTER — Other Ambulatory Visit: Payer: Self-pay | Admitting: Internal Medicine

## 2013-03-25 NOTE — Telephone Encounter (Signed)
Done erx 

## 2013-04-26 ENCOUNTER — Other Ambulatory Visit: Payer: Self-pay | Admitting: Internal Medicine

## 2013-04-29 ENCOUNTER — Other Ambulatory Visit: Payer: Self-pay

## 2013-05-02 MED ORDER — VARENICLINE TARTRATE 1 MG PO TABS: ORAL_TABLET | ORAL | Status: DC

## 2013-06-07 ENCOUNTER — Other Ambulatory Visit: Payer: Self-pay | Admitting: Internal Medicine

## 2013-07-14 ENCOUNTER — Other Ambulatory Visit: Payer: Self-pay | Admitting: Internal Medicine

## 2013-07-19 ENCOUNTER — Ambulatory Visit (INDEPENDENT_AMBULATORY_CARE_PROVIDER_SITE_OTHER): Payer: BC Managed Care – PPO | Admitting: Internal Medicine

## 2013-07-19 ENCOUNTER — Encounter: Payer: Self-pay | Admitting: Internal Medicine

## 2013-07-19 VITALS — BP 120/72 | HR 64 | Temp 98.1°F | Wt 261.0 lb

## 2013-07-19 DIAGNOSIS — G8929 Other chronic pain: Secondary | ICD-10-CM

## 2013-07-19 DIAGNOSIS — M25569 Pain in unspecified knee: Secondary | ICD-10-CM

## 2013-07-19 MED ORDER — ZOLPIDEM TARTRATE 10 MG PO TABS: ORAL_TABLET | ORAL | Status: DC

## 2013-07-19 NOTE — Patient Instructions (Addendum)
Knee pain with click when rising from sitting position w/o lockout or collapse suggest mild to moderate symptoms of a torn meniscus - see AAOS handout  Plan minimimize high impact activity (walking is not high impact)  iceing the knee  Anti-inflammatory meds  For increased symptoms or pain - referral to sports medicine or orthopedics.

## 2013-07-19 NOTE — Progress Notes (Signed)
  Subjective:    Patient ID: Tyler Griffin, male    DOB: 1981-05-11, 32 y.o.   MRN: [id number]  HPI He is working on smoking cessation- has started taking Chantix.  He has been very active lately - walking several miles and hiking. He will get a Sharp pain in the right knee. He will get a click with slow extension of the right leg. Able to do all his acitivities  Past Medical History  Diagnosis Date  . Anal fissure     underwent fissurotomy  . Hemorrhoids    Past Surgical History  Procedure Laterality Date  . Excisional hemorrhoidectomy  2008  . Anal fissure repair  2009   Family History  Problem Relation Age of Onset  . Heart disease Father     CAD/CABG  . Colon cancer Other    History   Social History  . Marital Status: Married    Spouse Name: N/A    Number of Children: N/A  . Years of Education: N/A   Occupational History  . Therapist, occupational    Social History Main Topics  . Smoking status: Current Every Day Smoker -- 0.50 packs/day    Types: Cigars  . Smokeless tobacco: Not on file     Comment: 1/2 pk  . Alcohol Use: Yes     Comment: 6 pack on weekends  . Drug Use: No  . Sexual Activity: Not on file   Other Topics Concern  . Not on file   Social History Narrative   Married 2011   Regular Exercise -  YES     Current Outpatient Prescriptions on File Prior to Visit  Medication Sig Dispense Refill  . CHANTIX CONTINUING MONTH PAK 1 MG tablet TAKE 1 TABLET BY MOUTH TWICE A DAY  56 tablet  0  . Multiple Vitamins-Minerals (MULTIVITAMIN,TX-MINERALS) tablet Take 1 tablet by mouth daily.         No current facility-administered medications on file prior to visit.      Review of Systems System review is negative for any constitutional, cardiac, pulmonary, GI or neuro symptoms or complaints other than as described in the HPI.     Objective:   Physical Exam Filed Vitals:   07/19/13 1120  BP: 120/72  Pulse: 64  Temp: 98.1 F (36.7 C)   Wt  Readings from Last 3 Encounters:  07/19/13 261 lb (118.389 kg)  11/17/11 250 lb 6.4 oz (113.581 kg)  08/18/11 242 lb 6 oz (109.941 kg)   Gen'l - overweight man in no distress Cor - 2+ pulse, RRR Pulm - normal respirations MSK - full passive ROM right knee, normal active ROM, no crepitus, no click elicited./ Tender to palp anterior joint line.       Assessment & Plan:

## 2013-07-20 DIAGNOSIS — G8929 Other chronic pain: Secondary | ICD-10-CM | POA: Insufficient documentation

## 2013-07-20 NOTE — Assessment & Plan Note (Signed)
Pain when rising from sitting position. Occasional click - c/w torn meniscus. Preserved ROM on exam  Plan Conservative care - avoid high impact, icing, NSAIDs  For progressive symptoms - sports medicine referral vs ortho

## 2013-08-17 ENCOUNTER — Other Ambulatory Visit: Payer: Self-pay | Admitting: Internal Medicine

## 2013-08-17 NOTE — Telephone Encounter (Signed)
Pt called states he is still having pain from his torn meniscus, requesting a referral.  Please advise

## 2013-08-22 ENCOUNTER — Telehealth: Payer: Self-pay | Admitting: Internal Medicine

## 2013-08-22 NOTE — Telephone Encounter (Signed)
Set up appointment with Dr. Katrinka Blazing for sports med consult

## 2013-08-22 NOTE — Telephone Encounter (Signed)
Pt called stated that he saw Dr. Debby Bud 07/19/13 about knee pain. Pt stated that his knee still not better and would like a referral to the surgeon or what Dr. Debby Bud think he should do at this point. Please advise.

## 2013-08-22 NOTE — Telephone Encounter (Signed)
Pt has an appt with Dr. Katrinka Blazing on 08/24/13.

## 2013-08-24 ENCOUNTER — Ambulatory Visit (INDEPENDENT_AMBULATORY_CARE_PROVIDER_SITE_OTHER): Payer: BC Managed Care – PPO | Admitting: Family Medicine

## 2013-08-24 ENCOUNTER — Encounter: Payer: Self-pay | Admitting: Family Medicine

## 2013-08-24 VITALS — BP 116/82 | HR 73 | Ht 72.0 in | Wt 255.0 lb

## 2013-08-24 DIAGNOSIS — M222X9 Patellofemoral disorders, unspecified knee: Secondary | ICD-10-CM | POA: Insufficient documentation

## 2013-08-24 DIAGNOSIS — M25569 Pain in unspecified knee: Secondary | ICD-10-CM

## 2013-08-24 DIAGNOSIS — M222X1 Patellofemoral disorders, right knee: Secondary | ICD-10-CM

## 2013-08-24 MED ORDER — MELOXICAM 15 MG PO TABS: 15.0000 mg | ORAL_TABLET | Freq: Every day | ORAL | Status: DC

## 2013-08-24 NOTE — Patient Instructions (Addendum)
Very nice to meet you Try the brace with activity Meloxicam daily for 10 days Ice 20 minutes 2 times a day especially after activity.  Wall sits and biking would be great.   Patellofemoral Syndrome Rehab  Isometric contractions of thigh - 10 x 10 secs  3 way straight leg raises - build to 3 sets of 30 and then add weights begin with no weight. When 3 x 30 reached, Add 2 lb. ankle weight. Increase to 3,4,5,6 when 3x30 achieved.  Drop squats - limit to 45 deg, 3x15  Modified lunge - running position, 3x15  Seated quad extensions, 3x15, add ankle weights  Step downs, 3x15 with body weight slowly on downward phase  Knee up and open hip: knee up and externally rotate hip to open position, hold 2 sec and repeat each leg, 30 reps  Come back in 4 weeks.  If still in pain I have many tricks.

## 2013-08-24 NOTE — Progress Notes (Signed)
  I'm seeing this patient by the request  of:  Illene Regulus, MD   CC: Right knee pain  [id number] is a pleasant 32 year old gentleman who was seen one month ago for knee pain by primary care provider. Patient states that it seemed to start after he was hiking much more. Patient describes the pain as a sharp pain in the right knee. Sometimes he does have a clicking sensation that is associated with pain when he does a slow extension. Patient describes the pain once again a sharp and usually more on the anterior medial aspect of the knee. Patient has been able to walk and has been attempting to do more exercises. This is been going on for a total of approximately 2 months. Patient was trying conservative care including icing and anti-inflammatories. Patient states he has not made significant improvement. Patient has tried over-the-counter anti-inflammatories with minimal improvement. Patient is a severity of 6/10.   Past medical, surgical, family and social history reviewed. Medications reviewed all in the electronic medical record.   Review of Systems: No headache, visual changes, nausea, vomiting, diarrhea, constipation, dizziness, abdominal pain, skin rash, fevers, chills, night sweats, weight loss, swollen lymph nodes, body aches, joint swelling, muscle aches, chest pain, shortness of breath, mood changes.   Objective:    Blood pressure 116/82, pulse 73, height 6' (1.829 m), weight 255 lb (115.667 kg), SpO2 96.00%.   General: No apparent distress alert and oriented x3 mood and affect normal, dressed appropriately.  HEENT: Pupils equal, extraocular movements intact Respiratory: Patient's speak in full sentences and does not appear short of breath Cardiovascular: No lower extremity edema, non tender, no erythema Skin: Warm dry intact with no signs of infection or rash on extremities or on axial skeleton. Abdomen: Soft nontender Neuro: Cranial nerves II through XII are intact, neurovascularly  intact in all extremities with 2+ DTRs and 2+ pulses. Lymph: No lymphadenopathy of posterior or anterior cervical chain or axillae bilaterally.  Gait normal with good balance and coordination.  MSK: Non tender with full range of motion and good stability and symmetric strength and tone of shoulders, elbows, wrist, hip and ankles bilaterally.   Knee: Normal to inspection with no erythema or effusion or obvious bony abnormalities. Palpation normal with no warmth, joint line tenderness, patellar tenderness, or condyle tenderness. ROM full in flexion and extension and lower leg rotation. Ligaments with solid consistent endpoints including ACL, PCL, LCL, MCL. Negative Mcmurray's, Apley's, and Thessalonian tests. Severe painful patellar compression.Tracks laterally Patellar glide with mild crepitus. Patellar and quadriceps tendons unremarkable. Hamstring and quadriceps strength is normal.   MSK US performed of: Right knee This study was ordered, performed, and interpreted by Terrilee Files D.O.  Knee: Right All structures visualized. Anteromedial, anterolateral, posteromedial, and posterolateral menisci unremarkable without tearing, fraying, effusion, or displacement. Patellar Tendon unremarkable on long and transverse views without effusion. No abnormality of prepatellar bursa. LCL and MCL unremarkable on long and transverse views. No abnormality of origin of medial or lateral head of the gastrocnemius. Patient does have mild spurring of the superior lateral aspect of the patella as well as the lateral condyle  IMPRESSION:  Patellofemoral syndrome    Impression and Recommendations:     This case required medical decision making of moderate complexity.

## 2013-08-24 NOTE — Assessment & Plan Note (Signed)
Patient will try conservative therapy. We discussed diagnosis prognosis and rehabilitation exercises. The patient was given a brace today to help with quadriceps firing. This was fitted by me today. Patient will wear the brace with activity. Meloxicam daily for 10 days then as needed Icing protocol Home exercise program given today and discussed proper technique Patient come back again in 4 weeks. If he continues to have pain at that time we'll consider cortisone injection, x-rays to rule out floating body, and consider nitroglycerin protocol.

## 2013-09-15 ENCOUNTER — Other Ambulatory Visit: Payer: Self-pay | Admitting: Internal Medicine

## 2013-09-21 ENCOUNTER — Ambulatory Visit (INDEPENDENT_AMBULATORY_CARE_PROVIDER_SITE_OTHER): Payer: BC Managed Care – PPO | Admitting: Family Medicine

## 2013-09-21 ENCOUNTER — Encounter: Payer: Self-pay | Admitting: Family Medicine

## 2013-09-21 VITALS — BP 118/80 | HR 84 | Wt 260.0 lb

## 2013-09-21 DIAGNOSIS — M222X1 Patellofemoral disorders, right knee: Secondary | ICD-10-CM

## 2013-09-21 DIAGNOSIS — M25569 Pain in unspecified knee: Secondary | ICD-10-CM

## 2013-09-21 NOTE — Progress Notes (Signed)
  CC: Right knee pain follow up  Tyler Griffin is a pleasant 32 year old gentleman returning for followup of right knee pain. Patient was diagnosed with patellofemoral pain syndrome. Patient was given a brace, meloxicam, and home exercise program. Patient has not been quite as diligent as he should be but has noticed significant improvement we does do the exercises. Patient states that he is approximately 60-70% better. Patient denies having any pain with his regular activities and is only having the discomfort when he squats greater than 90. Patient is no longer wearing the brace a regular basis because he feels he does not need it. Patient also states he has not been doing icing as regular. Patient is sleeping comfortably. Patient is happy with the results so far.   Past medical, surgical, family and social history reviewed. Medications reviewed all in the electronic medical record.   Review of Systems: No headache, visual changes, nausea, vomiting, diarrhea, constipation, dizziness, abdominal pain, skin rash, fevers, chills, night sweats, weight loss, swollen lymph nodes, body aches, joint swelling, muscle aches, chest pain, shortness of breath, mood changes.   Objective:    Blood pressure 118/80, pulse 84, weight 260 lb (117.935 kg), SpO2 97.00%.   General: No apparent distress alert and oriented x3 mood and affect normal, dressed appropriately.  HEENT: Pupils equal, extraocular movements intact Respiratory: Patient's speak in full sentences and does not appear short of breath Cardiovascular: No lower extremity edema, non tender, no erythema Skin: Warm dry intact with no signs of infection or rash on extremities or on axial skeleton. Abdomen: Soft nontender Neuro: Cranial nerves II through XII are intact, neurovascularly intact in all extremities with 2+ DTRs and 2+ pulses. Lymph: No lymphadenopathy of posterior or anterior cervical chain or axillae bilaterally.  Gait normal with good balance  and coordination.  MSK: Non tender with full range of motion and good stability and symmetric strength and tone of shoulders, elbows, wrist, hip and ankles bilaterally.   Knee: Normal to inspection with no erythema or effusion or obvious bony abnormalities. Palpation normal with no warmth, joint line tenderness, patellar tenderness, or condyle tenderness. ROM full in flexion and extension and lower leg rotation. Ligaments with solid consistent endpoints including ACL, PCL, LCL, MCL. Negative Mcmurray's, Apley's, and Thessalonian tests. Still some pain with patellar compression Patellar glide with mild crepitus. Patellar and quadriceps tendons unremarkable. Hamstring and quadriceps strength is normal. Patient has had increasing hypertrophy of the vastus medialis oblique muscle.    Impression and Recommendations:     This case required medical decision making of moderate complexity.

## 2013-09-21 NOTE — Assessment & Plan Note (Signed)
The patient has improved considerably even without volleying instructions properly. Patient was given other exercises for strengthening now Focusing more on the vastus medialis oblique. Discussed weight loss. Continue medications as needed Discussed the importance of icing Patient will followup again in 4 weeks. Soreness is doing well at that time he likely will need to be seen as needed.

## 2013-09-21 NOTE — Patient Instructions (Signed)
Your doing great Brace with hiking or a lot of walking Meloxicam only as needed Continue exercises most days of the week Wall sits 65 degree bend hold for 30 seconds, relax for 30 repeat 5 times daily at least.  Then in 1 or 2 weeks start doing any activity you want.  See you in 4 weeks.

## 2013-09-29 ENCOUNTER — Telehealth: Payer: Self-pay

## 2013-09-29 NOTE — Telephone Encounter (Signed)
The patient called and is hoping to dispute a charge Dr.Smith charged on his last visit.  He was given the billing office's number, however, they stated he would have to get the charge taken off by the office.  He mentioned he was charged for an ultra sound incorrectly.   Where/Who can I direct this concern to?    Thanks!

## 2013-09-30 NOTE — Telephone Encounter (Signed)
Spoke with Tyler Griffin, he had questions about how he was billed for the "strapping of knee." Tyler Griffin states he was billed $111, i advised Tyler Griffin I would discuss with Dr. Katrinka Blazing as well the manager & get back in touch with him next week. Tyler Griffin was very understanding.

## 2013-09-30 NOTE — Telephone Encounter (Signed)
lmovm for pt to return call.  

## 2013-10-10 ENCOUNTER — Other Ambulatory Visit: Payer: Self-pay | Admitting: Internal Medicine

## 2013-10-11 NOTE — Telephone Encounter (Signed)
Left detailed msg on pt's vmail.  

## 2013-10-21 ENCOUNTER — Ambulatory Visit: Payer: BC Managed Care – PPO | Admitting: Family Medicine

## 2013-12-30 ENCOUNTER — Telehealth: Payer: Self-pay | Admitting: *Deleted

## 2013-12-30 ENCOUNTER — Encounter: Payer: Self-pay | Admitting: *Deleted

## 2013-12-30 NOTE — Telephone Encounter (Signed)
A user error has taken place.

## 2013-12-30 NOTE — Telephone Encounter (Signed)
Patient phoned requesting a form so that his insurance company would cover the brace ordered by Z. Smith for his runner's knee (09/2013). Returned patient's call, no answer, left voicemail for him to return call & clarify requested form.  CB# [phone]

## 2014-01-12 ENCOUNTER — Other Ambulatory Visit: Payer: Self-pay | Admitting: Internal Medicine

## 2014-10-17 ENCOUNTER — Ambulatory Visit: Payer: BC Managed Care – PPO | Admitting: Family

## 2015-01-18 ENCOUNTER — Ambulatory Visit (INDEPENDENT_AMBULATORY_CARE_PROVIDER_SITE_OTHER): Payer: BLUE CROSS/BLUE SHIELD | Admitting: Family

## 2015-01-18 ENCOUNTER — Encounter: Payer: Self-pay | Admitting: Family

## 2015-01-18 VITALS — BP 100/60 | HR 80 | Temp 98.3°F | Resp 18 | Ht 72.0 in | Wt 267.4 lb

## 2015-01-18 DIAGNOSIS — Z720 Tobacco use: Secondary | ICD-10-CM

## 2015-01-18 DIAGNOSIS — F17200 Nicotine dependence, unspecified, uncomplicated: Secondary | ICD-10-CM

## 2015-01-18 DIAGNOSIS — D179 Benign lipomatous neoplasm, unspecified: Secondary | ICD-10-CM

## 2015-01-18 MED ORDER — VARENICLINE TARTRATE 1 MG PO TABS: 1.0000 mg | ORAL_TABLET | Freq: Two times a day (BID) | ORAL | Status: DC

## 2015-01-18 NOTE — Patient Instructions (Signed)
Lipoma  A lipoma is a noncancerous (benign) tumor composed of fat cells. They are usually found under the skin (subcutaneous). A lipoma may occur in any tissue of the body that contains fat. Common areas for lipomas to appear include the back, shoulders, buttocks, and thighs. Lipomas are a very common soft tissue growth. They are soft and grow slowly. Most problems caused by a lipoma depend on where it is growing.  DIAGNOSIS   A lipoma can be diagnosed with a physical exam. These tumors rarely become cancerous, but radiographic studies can help determine this for certain. Studies used may include:  · Computerized X-ray scans (CT or CAT scan).  · Computerized magnetic scans (MRI).  TREATMENT   Small lipomas that are not causing problems may be watched. If a lipoma continues to enlarge or causes problems, removal is often the best treatment. Lipomas can also be removed to improve appearance. Surgery is done to remove the fatty cells and the surrounding capsule. Most often, this is done with medicine that numbs the area (local anesthetic). The removed tissue is examined under a microscope to make sure it is not cancerous. Keep all follow-up appointments with your caregiver.  SEEK MEDICAL CARE IF:   · The lipoma becomes larger or hard.  · The lipoma becomes painful, red, or increasingly swollen. These could be signs of infection or a more serious condition.  Document Released: 10/17/2002 Document Revised: 01/19/2012 Document Reviewed: 03/29/2010  [employer]® Patient Information ©2015 [employer]. This information is not intended to replace advice given to you by your health care provider. Make sure you discuss any questions you have with your health care provider.

## 2015-01-18 NOTE — Progress Notes (Signed)
   Subjective:    Patient ID: Tyler Griffin, male    DOB: 1981/03/17, 34 y.o.   MRN: [id number]  Chief Complaint  Patient presents with  . Establish Care    refill of chantix    HPI:  Tyler Griffin is a 34 y.o. male who presents today to establish care with this provider and discuss tobacco cessation.  1) Tobbacco abuse - Quit the first week of January through the use of Chantix. This is the 3rd attempt at quitting and is requesting a refill of the Chantix.   2) Nodules - Associated symptom of cyst present on wrist and back have been going on for years. Located on the medial aspect of his right wrist and left lateral flank. Pain intensity is 0/10. Described as firm and moveable.   No Known Allergies   Current Outpatient Prescriptions on File Prior to Visit  Medication Sig Dispense Refill  . CHANTIX 1 MG tablet TAKE 1 TABLET BY MOUTH TWICE A DAY 56 tablet 0   No current facility-administered medications on file prior to visit.    Review of Systems  Skin:       Positive for cyst.   Psychiatric/Behavioral: Negative for hallucinations and sleep disturbance.      Objective:    BP 100/60 mmHg  Pulse 80  Temp(Src) 98.3 F (36.8 C) (Oral)  Resp 18  Ht 6' (1.829 m)  Wt 267 lb 6.4 oz (121.292 kg)  BMI 36.26 kg/m2  SpO2 96% Nursing note and vital signs reviewed.  Physical Exam  Constitutional: He is oriented to person, place, and time. He appears well-developed and well-nourished. No distress.  Cardiovascular: Normal rate, regular rhythm, normal heart sounds and intact distal pulses.   Pulmonary/Chest: Effort normal and breath sounds normal.  Neurological: He is alert and oriented to person, place, and time.  Skin: Skin is warm and dry.  Left flank: Small half inch by half inch firm nontender cyst present underneath skin. No obvious discharge or redness or swelling noted.  Right wrist: Small half inch by half inch firm nontender cyst present underneath skin. No obvious  discharge or redness or swelling noted.   Psychiatric: He has a normal mood and affect. His behavior is normal. Judgment and thought content normal.       Assessment & Plan:

## 2015-01-18 NOTE — Progress Notes (Signed)
Pre visit review using our clinic review tool, if applicable. No additional management support is needed unless otherwise documented below in the visit note. 

## 2015-01-18 NOTE — Assessment & Plan Note (Signed)
Patient continues to remain smoke free since the first week of January. Smoking cessation is stable with current use of Chantix. Continue current dosage of Chantix 1 mg twice a day. Follow-up in one month or sooner if needed.

## 2015-01-18 NOTE — Assessment & Plan Note (Signed)
Symptoms and exam consistent with lipoma of both wrist and back. Continue to monitor at this time.

## 2015-02-16 ENCOUNTER — Other Ambulatory Visit: Payer: Self-pay | Admitting: Family

## 2015-03-08 ENCOUNTER — Encounter: Payer: Self-pay | Admitting: Family

## 2015-03-08 ENCOUNTER — Ambulatory Visit (INDEPENDENT_AMBULATORY_CARE_PROVIDER_SITE_OTHER): Payer: BLUE CROSS/BLUE SHIELD | Admitting: Family

## 2015-03-08 VITALS — BP 110/70 | HR 72 | Temp 98.1°F | Resp 18 | Ht 72.0 in | Wt 265.8 lb

## 2015-03-08 DIAGNOSIS — T148XXA Other injury of unspecified body region, initial encounter: Secondary | ICD-10-CM | POA: Insufficient documentation

## 2015-03-08 DIAGNOSIS — Z23 Encounter for immunization: Secondary | ICD-10-CM

## 2015-03-08 DIAGNOSIS — T148 Other injury of unspecified body region: Secondary | ICD-10-CM

## 2015-03-08 NOTE — Progress Notes (Signed)
   Subjective:    Patient ID: Tyler Griffin, male    DOB: 08-09-1981, 34 y.o.   MRN: [id number]  Chief Complaint  Patient presents with  . Bruise    has had a bruise that has been on his calf for over a year now, no discomfort or pain or swelling    HPI:  Tyler Griffin is a 34 y.o. male with a PMH of hemorrhoids, low back pain and tobacco use who presents today for an acute office visit.   1) Spot on leg - Associated symptoms of a brusie located on his left posterior lower leg. Denies any associated trauma to the area. Described as a small, discolored area that has not changed in size or shape. Reports the color has improved slightly over time. Denies any pain or discomfort. Denies any modifying factors that make it better or worse.  2) immunization- patient is requesting a TDap vaccination with the anticipated birth of his first child in a couple months.   No Known Allergies   Current Outpatient Prescriptions on File Prior to Visit  Medication Sig Dispense Refill  . CHANTIX 1 MG tablet TAKE 1 TABLET BY MOUTH TWICE A DAY 60 tablet 0   No current facility-administered medications on file prior to visit.    Review of Systems  Constitutional: Negative for fever and chills.  Skin: Positive for rash.      Objective:    BP 110/70 mmHg  Pulse 72  Temp(Src) 98.1 F (36.7 C) (Oral)  Resp 18  Ht 6' (1.829 m)  Wt 265 lb 12.8 oz (120.566 kg)  BMI 36.04 kg/m2  SpO2 97% Nursing note and vital signs reviewed.  Physical Exam  Constitutional: He is oriented to person, place, and time. He appears well-developed and well-nourished. No distress.  Cardiovascular: Normal rate, regular rhythm, normal heart sounds and intact distal pulses.   Pulmonary/Chest: Effort normal and breath sounds normal.  Neurological: He is alert and oriented to person, place, and time.  Skin: Skin is warm and dry.  2 cm mostly annular area with some green/yellow/purplish coloration located on the medial aspect  of the left calf. No evidence of rash or inflammation.   Psychiatric: He has a normal mood and affect. His behavior is normal. Judgment and thought content normal.       Assessment & Plan:

## 2015-03-08 NOTE — Patient Instructions (Signed)
Thank you for choosing [facility].  Summary/Instructions:  Your calf appears as a small bruise. To improve healing may try using heat packs to bring blood flow to the area.   If your symptoms worsen or fail to improve, please contact our office for further instruction, or in case of emergency go directly to the emergency room at the closest medical facility.

## 2015-03-08 NOTE — Progress Notes (Signed)
Pre visit review using our clinic review tool, if applicable. No additional management support is needed unless otherwise documented below in the visit note. 

## 2015-03-08 NOTE — Assessment & Plan Note (Signed)
Symptoms and exam consistent with slow resolving contusion of the calf. Continue conservative treatment at this time with stretching and heat as needed. Follow-up if symptoms worsen or fail to improve.

## 2015-03-09 ENCOUNTER — Telehealth: Payer: Self-pay | Admitting: Family

## 2015-03-09 NOTE — Telephone Encounter (Signed)
[facility] [place] - Client [facility]  Patient Name: Tyler Griffin  DOB: 1981/01/06    Initial Comment Caller states had the TDap shot yesterday, last night he has had fever, and chills , wants to know if it could be from the shot.    Nurse Assessment  Nurse: Wynetta Emery, RN, Baker Janus Date/Time Eilene Ghazi Time): 03/09/2015 9:37:00 AM  Confirm and document reason for call. If symptomatic, describe symptoms. ---Aaron Edelman received TDap shot yesterday - woke up at 3am feeling feverish and chilling -- associated with the shot yesterday -- attempted to get ready for work and came back just checking to see if related to shot  Has the patient traveled out of the country within the last 30 days? ---No  Does the patient require triage? ---No  Please document clinical information provided and list any resource used. ---Nurse advised this is usually due to the injection -- like having flu like symptoms treat accordingly fluids, fever reducer and rest SYMPTOMS of an Immunization Reaction (Vaccine Reaction) include: Minor temporary adverse reactions are common. Examples include local pain and swelling at the injection site, fever, headache, muscle aches. Most local reactions at the injection site occur within 2 days. Fever with most vaccines begins within 24 hours and lasts 2-3 days. With live vaccines (MMR and Chickenpox), fever and systemic reactions usually begin within 1 and 4 weeks. Serious adverse reactions are rare. Anaphylaxis can occur with any vaccine. Anaphylactic symptoms start within 2 hours (usually within 20 minutes) after injection.     Guidelines    Guideline Title Affirmed Question Affirmed Notes       Final Disposition User   Attempt made - message left Wynetta Emery, Therapist, sports, Baker Janus

## 2015-03-16 ENCOUNTER — Telehealth: Payer: Self-pay | Admitting: *Deleted

## 2015-03-16 NOTE — Telephone Encounter (Signed)
[facility] - Client [facility] Patient Name: Tyler Griffin Gender: Male DOB: May 19, 1981 Age: 34 Y 1 M 9 D Return Phone Number: [phone] (Primary) Address: [address] City/State/Zip: [place] [address] Client [facility] - Client Client Site [facility] Contact Type Call Call Type Triage / Clinical Relationship To Patient Self Appointment Disposition EMR Appointment Not Necessary Info pasted into Epic Yes Return Phone Number [phone] (Primary) Chief Complaint Fever (non urgent symptom) (> THREE MONTHS) Initial Comment Caller states had the TDap shot yesterday, last night he has had fever, and chills , wants to know if it could be from the shot. Nurse Assessment Nurse: Wynetta Emery, RN, Baker Janus Date/Time Eilene Ghazi Time): 03/09/2015 9:37:00 AM Confirm and document reason for call. If symptomatic, describe symptoms. ---Aaron Edelman received TDap shot yesterday - woke up at 3am feeling feverish and chilling -- associated with the shot yesterday -- attempted to get ready for work and came back just checking to see if related to shot Has the patient traveled out of the country within the last 30 days? ---No Does the patient require triage? ---No Please document clinical information provided and list any resource used. ---Nurse advised this is usually due to the injection -- like having flu like symptoms treat accordingly fluids, fever reducer and rest SYMPTOMS of an Immunization Reaction (Vaccine Reaction) include: Minor temporary adverse reactions are common. Examples include local pain and swelling at the injection site, fever, headache, muscle aches. Most local reactions at the injection site occur within 2 days. Fever with most vaccines begins within 24 hours and lasts 2-3 days. With live vaccines (MMR and Chickenpox), fever and systemic reactions usually begin within 1 and 4  weeks. Serious adverse reactions are rare. Anaphylaxis can occur with any vaccine. Anaphylactic symptoms start within 2 hours (usually within 20 minutes) after injection. Guidelines Guideline Title Affirmed Question Affirmed Notes Nurse Date/Time (Eastern Time) Disp. Time Eilene Ghazi Time) Disposition Final User 03/09/2015 9:30:30 AM Attempt made - message left Ivin Booty 03/09/2015 9:50:36 AM Clinical Call Yes Wynetta Emery, RN, Juleen China NOTE: All timestamps contained within this report are represented as [place] Standard Time. CONFIDENTIALTY NOTICE: This fax transmission is intended only for the addressee. It contains information that is legally privileged, confidential or otherwise protected from use or disclosure. If you are not the intended recipient, you are strictly prohibited from reviewing, disclosing, copying using or disseminating any of this information or taking any action in reliance on or regarding this information. If you have received this fax in error, please notify us immediately by telephone so that we can arrange for its return to [place]. Phone: [phone], Toll-Free: [phone], Fax: [phone] Page: 2 of 2 Call Id: [id number] After Care Instructions Given Call Event Type User Date / Octavia Bruckner

## 2015-03-25 ENCOUNTER — Other Ambulatory Visit: Payer: Self-pay | Admitting: Family

## 2015-04-13 ENCOUNTER — Encounter: Payer: Self-pay | Admitting: Family

## 2015-04-13 ENCOUNTER — Ambulatory Visit (INDEPENDENT_AMBULATORY_CARE_PROVIDER_SITE_OTHER): Payer: BLUE CROSS/BLUE SHIELD | Admitting: Family

## 2015-04-13 VITALS — BP 106/70 | HR 93 | Temp 98.1°F | Resp 18 | Ht 72.0 in | Wt 265.0 lb

## 2015-04-13 DIAGNOSIS — T148 Other injury of unspecified body region: Secondary | ICD-10-CM

## 2015-04-13 DIAGNOSIS — T148XXA Other injury of unspecified body region, initial encounter: Secondary | ICD-10-CM

## 2015-04-13 NOTE — Patient Instructions (Signed)
Thank you for choosing [facility].  Summary/Instructions:  Referrals have been made during this visit. You should expect to hear back from our schedulers in about 7-10 days in regards to establishing an appointment with the specialists we discussed.   If your symptoms worsen or fail to improve, please contact our office for further instruction, or in case of emergency go directly to the emergency room at the closest medical facility.

## 2015-04-13 NOTE — Progress Notes (Signed)
Pre visit review using our clinic review tool, if applicable. No additional management support is needed unless otherwise documented below in the visit note. 

## 2015-04-13 NOTE — Progress Notes (Signed)
   Subjective:    Patient ID: Tyler Griffin, male    DOB: 09/03/1981, 34 y.o.   MRN: [id number]  Chief Complaint  Patient presents with  . Bruise    has a bruise on his leg that has been there for a year and is now feeling like a really tight pulling sensation like he has been working out    HPI:  Tyler Griffin is a 34 y.o. male with a PMH of  who presents today   Associated symptom of of a "bruise" located on the lower aspect of his left calf has been there for about a year and is described as a tight sensation. Timing of the tightness is worse at night and better in the morning. Denies any modifying factors that make it better or worse.    No Known Allergies   Current Outpatient Prescriptions on File Prior to Visit  Medication Sig Dispense Refill  . CHANTIX 1 MG tablet TAKE 1 TABLET BY MOUTH TWICE A DAY 60 tablet 0   No current facility-administered medications on file prior to visit.    Review of Systems  Constitutional: Negative for fever and chills.  Skin: Positive for color change.      Objective:    BP 106/70 mmHg  Pulse 93  Temp(Src) 98.1 F (36.7 C) (Oral)  Resp 18  Ht 6' (1.829 m)  Wt 265 lb (120.203 kg)  BMI 35.93 kg/m2  SpO2 96% Nursing note and vital signs reviewed.  Physical Exam  Constitutional: He is oriented to person, place, and time. He appears well-developed and well-nourished. No distress.  Cardiovascular: Normal rate, regular rhythm, normal heart sounds and intact distal pulses.   Pulmonary/Chest: Effort normal and breath sounds normal.  Neurological: He is alert and oriented to person, place, and time.  Skin: Skin is warm and dry.  4 mm area located on the medial anterior tibia with mild tenderness and mild discoloration. No palpable masses noted. Distal pulses are intact and appropriate.  Psychiatric: He has a normal mood and affect. His behavior is normal. Judgment and thought content normal.       Assessment & Plan:   Problem List  Items Addressed This Visit      Other   Bruise - Primary    Continues to experience symptoms of bruise-like pain in his lower left extremity. Cannot rule out venous origin. Obtain venous ultrasound of the left leg to rule out DVT. Follow up with sports medicine for imaging to rule out underlying pathology.      Relevant Orders   Ultrasound doppler venous legs bilat

## 2015-04-13 NOTE — Assessment & Plan Note (Signed)
Continues to experience symptoms of bruise-like pain in his lower left extremity. Cannot rule out venous origin. Obtain venous ultrasound of the left leg to rule out DVT. Follow up with sports medicine for imaging to rule out underlying pathology.

## 2015-04-17 ENCOUNTER — Encounter: Payer: Self-pay | Admitting: Family Medicine

## 2015-04-17 ENCOUNTER — Other Ambulatory Visit (INDEPENDENT_AMBULATORY_CARE_PROVIDER_SITE_OTHER): Payer: BLUE CROSS/BLUE SHIELD

## 2015-04-17 ENCOUNTER — Ambulatory Visit (INDEPENDENT_AMBULATORY_CARE_PROVIDER_SITE_OTHER): Payer: BLUE CROSS/BLUE SHIELD | Admitting: Family Medicine

## 2015-04-17 VITALS — BP 110/80 | HR 84 | Ht 72.0 in | Wt 268.0 lb

## 2015-04-17 DIAGNOSIS — M79662 Pain in left lower leg: Secondary | ICD-10-CM | POA: Diagnosis not present

## 2015-04-17 DIAGNOSIS — S86112A Strain of other muscle(s) and tendon(s) of posterior muscle group at lower leg level, left leg, initial encounter: Secondary | ICD-10-CM | POA: Insufficient documentation

## 2015-04-17 NOTE — Progress Notes (Signed)
Pre visit review using our clinic review tool, if applicable. No additional management support is needed unless otherwise documented below in the visit note. 

## 2015-04-17 NOTE — Patient Instructions (Signed)
Good to see you again .  Ice 20 minutes 2 times daily. Usually after activity and before bed. Heel lift in shoe  Compression sleeve from Dicks or omega Arnica gel or lotion for the bruising pennsaid pinkie amount topically 2 times daily as needed.  Exercises 3 times a week.  See me agai nin 3 weeks if not better

## 2015-04-17 NOTE — Progress Notes (Signed)
[facility] Tyler Griffin, [name] [address] Phone: [phone] Subjective:    I'm seeing this patient by the request  of: Mauricio Po, FNP    CC: Left calf pain  MEQ:[id number] Tyler Griffin is a 34 y.o. male coming in with complaint of left calf pain. Patient states that he has had this pain on and off for proximal wing one year. Notices now it seems to be getting a little bit worse with even daily activities. Nothing that stops him from activity and puts the severity of 3 out of 10. Patient denies any nighttime awakening or any numbness or tingling. Does not remember any true injury except for 4 years ago when he started running on a regular basis. Patient states though that it seems to not be improving. Patient would like to know what the diagnosis is. Patient denies any fevers chills or any abnormal weight loss. Does not remember any true injury except for 4 years ago when he started running on a regular basis. Patient states though that it seems to not be improving. Patient would like to know what the diagnosis is. Patient denies any fevers chills or any abnormal weight loss.     Past medical history, social, surgical and family history all reviewed in electronic medical record.   Review of Systems: No headache, visual changes, nausea, vomiting, diarrhea, constipation, dizziness, abdominal pain, skin rash, fevers, chills, night sweats, weight loss, swollen lymph nodes, body aches, joint swelling, muscle aches, chest pain, shortness of breath, mood changes.   Objective Blood pressure 110/80, pulse 84, height 6' (1.829 m), weight 268 lb (121.564 kg), SpO2 97 %.  General: No apparent distress alert and oriented x3 mood and affect normal, dressed appropriately.  HEENT: Pupils equal, extraocular movements intact  Respiratory: Patient's speak in full sentences and does not appear short of breath  Cardiovascular: No lower extremity edema, non tender,  no erythema  Skin: Warm dry intact with no signs of infection or rash on extremities or on axial skeleton.  Abdomen: Soft nontender  Neuro: Cranial nerves II through XII are intact, neurovascularly intact in all extremities with 2+ DTRs and 2+ pulses.  Lymph: No lymphadenopathy of posterior or anterior cervical chain or axillae bilaterally.  Gait normal with good balance and coordination.  MSK:  Non tender with full range of motion and good stability and symmetric strength and tone of shoulders, elbows, wrist, hip, knee and ankles bilaterally.  His left calf on the medial aspect does have an area that has some mild bruising. This is tender to palpation in this area. Patient is full range of motion of the ankle and foot with neurovascular intact distally. Patient is able to go up on his toes without any significant difficult he. No numbness noted. Deep tendon reflexes 2+. Contralateral unremarkable  MSK US performed of: Left calf This study was ordered, performed, and interpreted by Charlann Boxer D.O.  Patient's medial gastrocnemius muscle where posterior tibialis tendon origin me does have significant scar tissue formation noted. Increasing Doppler flow noted. Mild hypoechoic changes but no true acute tear appreciated.  IMPRESSION: Chronic gastrocnemius muscle strain    procedure note 97989; 15 minutes spent for Therapeutic exercises as stated in above notes.  This included exercises focusing on stretching, strengthening, with significant focus on eccentric aspects.  Patient given pronation, supination, flexion and extension exercises. Patient also given Exercises for strengthening as well as stretching focusing on eccentric activities. Proper technique shown and  discussed handout in great detail with ATC.  All questions were discussed and answered.   Impression and Recommendations:     This case required medical decision making of moderate complexity.

## 2015-04-17 NOTE — Assessment & Plan Note (Signed)
She was having more of a strain of the gastrocnemius that I think is chronic in nature. This is at the insertion of the posterior tibialis muscle. I do think that this could be where natural defect is. Patient does have scar tissue formation in the area. 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 We discussed icing regimen and home exercises. Patient will try compression sleeve as well and given a trial topical anti-inflammatory. Patient come back in 3 weeks for further evaluation and treatment. Continuing to have trouble we'll consider x-ray to rule out bony abnormality but I think this is highly unlikely at this time. No systemic illnesses or symptoms at this time.

## 2015-04-27 ENCOUNTER — Other Ambulatory Visit: Payer: Self-pay | Admitting: Family

## 2015-05-24 ENCOUNTER — Encounter: Payer: Self-pay | Admitting: Gastroenterology

## 2015-06-04 ENCOUNTER — Other Ambulatory Visit: Payer: Self-pay | Admitting: Family

## 2015-07-05 ENCOUNTER — Other Ambulatory Visit: Payer: Self-pay | Admitting: Family

## 2015-07-19 ENCOUNTER — Telehealth: Payer: Self-pay | Admitting: Family

## 2015-07-19 NOTE — Telephone Encounter (Deleted)
Pt called in and just wanted to clarify what dr Tamala Julian said on his my chart about his mri

## 2015-09-06 ENCOUNTER — Telehealth: Payer: Self-pay | Admitting: Internal Medicine

## 2015-09-06 NOTE — Telephone Encounter (Signed)
[facility] - Client [facility] Patient Name: Tyler Griffin DOB: 1981/01/09 Initial Comment Caller stated his son has a bad sinus infection. Nurse Assessment Nurse: Markus Daft, RN, Sherre Poot Date/Time (Eastern Time): 09/06/2015 3:45:16 PM Confirm and document reason for call. If symptomatic, describe symptoms. ---Caller states that he has cold with cough. Thought he was improving and then today with coughing up green mucous, mild chest pain with coughing only, and chills. Taking Mucinex and Alka-Seltzer cold and sinus. He travels every day. -- His dtr had cold prior to him and thinks that he caught from her. Has the patient traveled out of the country within the last 30 days? ---Not Applicable Does the patient have any new or worsening symptoms? ---Yes Will a triage be completed? ---Yes Related visit to physician within the last 2 weeks? ---No Does the PT have any chronic conditions? (i.e. diabetes, asthma, etc.) ---Yes List chronic conditions. ---Seasonal allergies Guidelines Guideline Title Affirmed Question Affirmed Notes Cough - Acute Productive [1] Continuous (nonstop) coughing interferes with work or school AND [2] no improvement using cough treatment per Care Advice Final Disposition User See Physician within [place], Therapist, sports, [place] Comments Pt plans to go to [employer] of waiting to be seen tomorrow. He is 45 min. away from office. No available office visits for today left. He verb. understanding. Referrals REFERRED TO PCP OFFICE Disagree/Comply: Comply RN advised that he stop taking decongestants more than 2-3 days as he can have rebound congestion.  Instead he can try OTC zyrtec or claritin which would help his seasonal allergies.  Caller agreeable.

## 2016-12-31 ENCOUNTER — Telehealth: Payer: Self-pay | Admitting: Family

## 2016-12-31 NOTE — Telephone Encounter (Signed)
Patient is requesting to transfer from [facility] to [facility].  Please advise.

## 2016-12-31 NOTE — Telephone Encounter (Signed)
Yes, ok with me 

## 2017-01-08 NOTE — Telephone Encounter (Signed)
Got patient scheduled

## 2017-01-21 ENCOUNTER — Ambulatory Visit (INDEPENDENT_AMBULATORY_CARE_PROVIDER_SITE_OTHER): Payer: BLUE CROSS/BLUE SHIELD | Admitting: Internal Medicine

## 2017-01-21 ENCOUNTER — Encounter: Payer: Self-pay | Admitting: Internal Medicine

## 2017-01-21 VITALS — BP 108/68 | HR 88 | Temp 98.3°F | Wt 257.0 lb

## 2017-01-21 DIAGNOSIS — I83892 Varicose veins of left lower extremities with other complications: Secondary | ICD-10-CM | POA: Diagnosis not present

## 2017-01-21 NOTE — Progress Notes (Signed)
   Subjective:  Patient ID: MAVRIK BYNUM, male    DOB: [birth year]  Age: 36 y.o. MRN: [id number]  CC: Other (transfer)   HPI RAIDYN WASSINK presents for concerns about a 3 year hx of intermittently painful, swollen, "bruised" area of his LLE. The sx's are worsened by standing for long periods. He has no hx of trauma in the area.  History Kaimen has a past medical history of Anal fissure and Hemorrhoids.   He has a past surgical history that includes Excisional hemorrhoidectomy (2008) and Anal fissure repair (2009).   His family history includes Colon cancer in his other; Heart disease in his father.He reports that he quit smoking about 2 years ago. His smoking use included Cigars. He smoked 0.50 packs per day. He has never used smokeless tobacco. He reports that he drinks alcohol. He reports that he does not use drugs.  Outpatient Medications Prior to Visit  Medication Sig Dispense Refill  . CHANTIX 1 MG tablet TAKE 1 TABLET BY MOUTH TWICE A DAY 60 tablet 0   No facility-administered medications prior to visit.     ROS Review of Systems  All other systems reviewed and are negative.   Objective:  BP 108/68 (BP Location: Left Arm, Patient Position: Sitting)   Pulse 88   Temp 98.3 F (36.8 C) (Oral)   Wt 257 lb (116.6 kg)   SpO2 98%   BMI 34.86 kg/m   Physical Exam  Constitutional: No distress.  HENT:  Mouth/Throat: Oropharynx is clear and moist. No oropharyngeal exudate.  Eyes: Conjunctivae are normal. Right eye exhibits no discharge. Left eye exhibits no discharge. No scleral icterus.  Neck: Normal range of motion. Neck supple. No JVD present. No tracheal deviation present. No thyromegaly present.  Cardiovascular: Normal rate, regular rhythm, normal heart sounds and intact distal pulses.  Exam reveals no gallop and no friction rub.   No murmur heard. Pulmonary/Chest: Effort normal. No respiratory distress. He has no wheezes. He has no rales. He exhibits no tenderness.    Abdominal: Soft. Bowel sounds are normal. He exhibits no distension and no mass. There is no tenderness. There is no rebound and no guarding.  Musculoskeletal: Normal range of motion. He exhibits no edema, tenderness or deformity.       Legs: Lymphadenopathy:    He has no cervical adenopathy.  Skin: He is not diaphoretic.  Vitals reviewed.   No results found for: WBC, HGB, HCT, PLT, GLUCOSE, CHOL, TRIG, HDL, LDLDIRECT, LDLCALC, ALT, AST, NA, K, CL, CREATININE, BUN, CO2, TSH, PSA, INR, GLUF, HGBA1C, MICROALBUR  Assessment & Plan:   Breland was seen today for other.  Diagnoses and all orders for this visit:  Varicose veins of left lower extremity with complications- vascular referral to consider treatment options -     Ambulatory referral to Vascular Surgery   I have discontinued Mr. Berni's CHANTIX.  No orders of the defined types were placed in this encounter.    Follow-up: Return if symptoms worsen or fail to improve.  Scarlette Calico, MD

## 2017-01-21 NOTE — Patient Instructions (Signed)
Varicose Veins Varicose veins are veins that have become enlarged and twisted. They are usually seen in the legs but can occur in other parts of the body as well. What are the causes? This condition is the result of valves in the veins not working properly. Valves in the veins help to return blood from the leg to the heart. If these valves are damaged, blood flows backward and backs up into the veins in the leg near the skin. This causes the veins to become larger. What increases the risk? People who are on their feet a lot, who are pregnant, or who are overweight are more likely to develop varicose veins. What are the signs or symptoms?  Bulging, twisted-appearing, bluish veins, most commonly found on the legs.  Leg pain or a feeling of heaviness. These symptoms may be worse at the end of the day.  Leg swelling.  Changes in skin color. How is this diagnosed? A health care provider can usually diagnose varicose veins by examining your legs. Your health care provider may also recommend an ultrasound of your leg veins. How is this treated? Most varicose veins can be treated at home.However, other treatments are available for people who have persistent symptoms or want to improve the cosmetic appearance of the varicose veins. These treatment options include:  Sclerotherapy. A solution is injected into the vein to close it off.  Laser treatment. A laser is used to heat the vein to close it off.  Radiofrequency vein ablation. An electrical current produced by radio waves is used to close off the vein.  Phlebectomy. The vein is surgically removed through small incisions made over the varicose vein.  Vein ligation and stripping. The vein is surgically removed through incisions made over the varicose vein after the vein has been tied (ligated). Follow these instructions at home:   Do not stand or sit in one position for long periods of time. Do not sit with your legs crossed. Rest with your  legs raised during the day.  Wear compression stockings as directed by your health care provider. These stockings help to prevent blood clots and reduce swelling in your legs.  Do not wear other tight, encircling garments around your legs, pelvis, or waist.  Walk as much as possible to increase blood flow.  Raise the foot of your bed at night with 2-inch blocks.  If you get a cut in the skin over the vein and the vein bleeds, lie down with your leg raised and press on it with a clean cloth until the bleeding stops. Then place a bandage (dressing) on the cut. See your health care provider if it continues to bleed. Contact a health care provider if:  The skin around your ankle starts to break down.  You have pain, redness, tenderness, or hard swelling in your leg over a vein.  You are uncomfortable because of leg pain. This information is not intended to replace advice given to you by your health care provider. Make sure you discuss any questions you have with your health care provider. Document Released: 08/06/2005 Document Revised: 04/03/2016 Document Reviewed: 04/29/2016 Elsevier Interactive Patient Education  2017 [employer]  

## 2017-02-04 ENCOUNTER — Encounter: Payer: Self-pay | Admitting: Vascular Surgery

## 2017-02-17 ENCOUNTER — Ambulatory Visit (INDEPENDENT_AMBULATORY_CARE_PROVIDER_SITE_OTHER): Payer: BLUE CROSS/BLUE SHIELD | Admitting: Vascular Surgery

## 2017-02-17 ENCOUNTER — Encounter: Payer: Self-pay | Admitting: Vascular Surgery

## 2017-02-17 VITALS — BP 126/77 | HR 82 | Temp 97.8°F | Resp 18 | Ht 72.0 in | Wt 254.0 lb

## 2017-02-17 DIAGNOSIS — I83892 Varicose veins of left lower extremities with other complications: Secondary | ICD-10-CM | POA: Diagnosis not present

## 2017-02-17 NOTE — Progress Notes (Signed)
Subjective:     Patient ID: Tyler Griffin, male   DOB: [birth year], 36 y.o.   MRN: [id number]  HPI This 36 year old male referred for evaluation of varicose veins in the left leg. He's had no previous procedures. He noticed a bruise in his left medial calf about 3 years ago. There were some bulges associated with this. Since that time he has developed an aching sensation in that area which occurs from time to time and is very annoying. He has no distal edema. He has no history of DVT thrombophlebitis stasis ulcers or bleeding. He does not were elastic compression stockings. The symptoms are becoming worse and more frequent. He has no symptoms the contralateral right leg. He has no history of trauma to this area.  Past Medical History:  Diagnosis Date  . Anal fissure    underwent fissurotomy  . Hemorrhoids     Social History  Substance Use Topics  . Smoking status: Former Smoker    Packs/day: 0.50    Types: Cigars    Quit date: 11/13/2014  . Smokeless tobacco: Never Used     Comment: 1/2 pk  . Alcohol use Yes     Comment: 6 pack on weekends    Family History  Problem Relation Age of Onset  . Heart disease Father     CAD/CABG  . Colon cancer Other     No Known Allergies   Current Outpatient Prescriptions:  .  acetaminophen (TYLENOL) 500 MG tablet, Take 500 mg by mouth every 6 (six) hours as needed., Disp: , Rfl:   Vitals:   02/17/17 1053  BP: 126/77  Pulse: 82  Resp: 18  Temp: 97.8 F (36.6 C)  SpO2: 97%  Weight: 254 lb (115.2 kg)  Height: 6' (1.829 m)    Body mass index is 34.45 kg/m.         Review of Systems Has history of hemorrhoids. All other systems negative and complete review of systems in this healthy gentleman    Objective:   Physical Exam BP 126/77 (BP Location: Left Arm, Patient Position: Sitting, Cuff Size: Large)   Pulse 82   Temp 97.8 F (36.6 C)   Resp 18   Ht 6' (1.829 m)   Wt 254 lb (115.2 kg)   SpO2 97%   BMI 34.45 kg/m      Gen.-alert and oriented x3 in no apparent distress HEENT normal for age Lungs no rhonchi or wheezing Cardiovascular regular rhythm no murmurs carotid pulses 3+ palpable no bruits audible Abdomen soft nontender no palpable masses Musculoskeletal free of  major deformities Skin clear -no rashes Neurologic normal Lower extremities 3+ femoral and dorsalis pedis pulses palpable bilaterally with no edema Left leg with localized varicosities in the mid calf overlying great saphenous vein measuring 1-1/2 cm in diameter each. No hyperpigmentation or ulceration noted distally.  Today I performed a SonoSite ultrasound exam at the bedside. His left great saphenous vein is enlarged and appears to have reflux.       Painful varicosities left mid calf possibly due to gross reflux left great saphenous vein  causing symptoms which are affecting patient's daily living     Plan:     We'll schedule formal venous reflux study of left leg and have patient return to discuss these findings for formal recommendation

## 2017-02-18 ENCOUNTER — Other Ambulatory Visit: Payer: Self-pay | Admitting: *Deleted

## 2017-02-18 DIAGNOSIS — I83892 Varicose veins of left lower extremities with other complications: Secondary | ICD-10-CM

## 2017-02-18 NOTE — Addendum Note (Signed)
Addended by: Lianne Cure A on: 02/18/2017 09:18 AM   Modules accepted: Orders

## 2017-03-04 ENCOUNTER — Encounter: Payer: Self-pay | Admitting: Vascular Surgery

## 2017-03-06 ENCOUNTER — Ambulatory Visit (HOSPITAL_COMMUNITY)
Admission: RE | Admit: 2017-03-06 | Discharge: 2017-03-06 | Disposition: A | Discharge status: Home / Self Care | Payer: BLUE CROSS/BLUE SHIELD | Source: Ambulatory Visit | Attending: Vascular Surgery | Admitting: Vascular Surgery

## 2017-03-06 DIAGNOSIS — I83892 Varicose veins of left lower extremities with other complications: Secondary | ICD-10-CM | POA: Diagnosis not present

## 2017-03-09 ENCOUNTER — Ambulatory Visit: Payer: BLUE CROSS/BLUE SHIELD | Admitting: Vascular Surgery

## 2017-03-16 ENCOUNTER — Encounter: Payer: Self-pay | Admitting: Vascular Surgery

## 2017-03-16 ENCOUNTER — Ambulatory Visit (INDEPENDENT_AMBULATORY_CARE_PROVIDER_SITE_OTHER): Payer: BLUE CROSS/BLUE SHIELD | Admitting: Vascular Surgery

## 2017-03-16 VITALS — BP 131/73 | HR 93 | Temp 98.5°F | Resp 18 | Ht 72.0 in | Wt 248.0 lb

## 2017-03-16 DIAGNOSIS — I868 Varicose veins of other specified sites: Secondary | ICD-10-CM

## 2017-03-16 DIAGNOSIS — I83892 Varicose veins of left lower extremities with other complications: Secondary | ICD-10-CM | POA: Diagnosis not present

## 2017-03-16 NOTE — Progress Notes (Signed)
Subjective:     Patient ID: Tyler Griffin, male   DOB: 09-16-1981, 36 y.o.   MRN: [id number]  HPI This 36 year old male returns for further discussion regarding his painful varicosities in the medial calf of the left leg. These have been present for the last several months with increasing discomfort as the day progresses. He develops heaviness with aching throbbing burning discomfort. He returns today for discussion of his ultrasound results. He is currently not wearing elastic compression stockings.  Past Medical History:  Diagnosis Date  . Anal fissure    underwent fissurotomy  . Hemorrhoids     Social History  Substance Use Topics  . Smoking status: Former Smoker    Packs/day: 0.50    Types: Cigars    Quit date: 11/13/2014  . Smokeless tobacco: Never Used     Comment: 1/2 pk  . Alcohol use Yes     Comment: 6 pack on weekends    Family History  Problem Relation Age of Onset  . Heart disease Father     CAD/CABG  . Colon cancer Other     No Known Allergies   Current Outpatient Prescriptions:  .  acetaminophen (TYLENOL) 500 MG tablet, Take 500 mg by mouth every 6 (six) hours as needed., Disp: , Rfl:   Vitals:   03/16/17 1354  BP: 131/73  Pulse: 93  Resp: 18  Temp: 98.5 F (36.9 C)  SpO2: 96%  Weight: 248 lb (112.5 kg)  Height: 6' (1.829 m)    Body mass index is 33.63 kg/m.         Review of Systems Denies chest pain, dyspnea on exertion, PND, orthopnea, hemoptysis    Objective:   Physical Exam BP 131/73 (BP Location: Left Arm, Patient Position: Sitting, Cuff Size: Large)   Pulse 93   Temp 98.5 F (36.9 C)   Resp 18   Ht 6' (1.829 m)   Wt 248 lb (112.5 kg)   SpO2 96%   BMI 33.63 kg/m   Gen. well-developed well-nourished male no apparent distress alert and oriented 3 Lungs no rhonchi or wheezing Left leg with last of bulging varicosities in the medial calf over the great saphenous vein with trace distal edema. No active ulcer noted. 3+ dorsalis  pedis pulse palpable.  Today I reviewed the results of the formal venous duplex reflux exam which reveals gross reflux and an enlarged left great saphenous vein down to the knee level with no DVT     Assessment:     Painful varicosities left leg due to gross reflux left great saphenous vein causing symptoms which are affecting patient's daily living    Plan:         #1 long leg elastic compression stockings 20-30 mm gradient #2 elevate legs as much as possible #3 ibuprofen daily on a regular basis for pain #4 return in 3 months-if no significant improvement then he will need laser ablation left great saphenous vein plus multiple stab phlebectomy of painful varicosities and or foam sclerotherapy to be determined in 3 months

## 2017-05-18 ENCOUNTER — Other Ambulatory Visit (INDEPENDENT_AMBULATORY_CARE_PROVIDER_SITE_OTHER): Payer: BLUE CROSS/BLUE SHIELD

## 2017-05-18 ENCOUNTER — Ambulatory Visit (INDEPENDENT_AMBULATORY_CARE_PROVIDER_SITE_OTHER): Payer: BLUE CROSS/BLUE SHIELD | Admitting: Internal Medicine

## 2017-05-18 ENCOUNTER — Encounter: Payer: Self-pay | Admitting: Internal Medicine

## 2017-05-18 VITALS — BP 112/64 | HR 80 | Temp 97.5°F | Resp 16 | Ht 72.0 in | Wt 254.0 lb

## 2017-05-18 DIAGNOSIS — E781 Pure hyperglyceridemia: Secondary | ICD-10-CM | POA: Diagnosis not present

## 2017-05-18 DIAGNOSIS — Z0001 Encounter for general adult medical examination with abnormal findings: Secondary | ICD-10-CM | POA: Diagnosis not present

## 2017-05-18 DIAGNOSIS — Z0000 Encounter for general adult medical examination without abnormal findings: Secondary | ICD-10-CM

## 2017-05-18 DIAGNOSIS — E669 Obesity, unspecified: Secondary | ICD-10-CM | POA: Diagnosis not present

## 2017-05-18 LAB — COMPREHENSIVE METABOLIC PANEL
ALBUMIN: 4.6 g/dL (ref 3.5–5.2)
ALK PHOS: 91 U/L (ref 39–117)
ALT: 20 U/L (ref 0–53)
AST: 14 U/L (ref 0–37)
BUN: 18 mg/dL (ref 6–23)
CALCIUM: 9.6 mg/dL (ref 8.4–10.5)
CHLORIDE: 102 meq/L (ref 96–112)
CO2: 28 mEq/L (ref 19–32)
CREATININE: 0.81 mg/dL (ref 0.40–1.50)
GFR: 115.73 mL/min (ref >60.00)
Glucose, Bld: 107 mg/dL — ABNORMAL HIGH (ref 70–99)
POTASSIUM: 4.1 meq/L (ref 3.5–5.1)
Sodium: 137 mEq/L (ref 135–145)
TOTAL PROTEIN: 7.4 g/dL (ref 6.0–8.3)
Total Bilirubin: 0.6 mg/dL (ref 0.2–1.2)

## 2017-05-18 LAB — LIPID PANEL
CHOL/HDL RATIO: 5
CHOLESTEROL: 191 mg/dL (ref 0–200)
HDL: 39.7 mg/dL (ref >39.00)
NonHDL: 151.36
TRIGLYCERIDES: 399 mg/dL — AB (ref 0.0–149.0)
VLDL: 79.8 mg/dL — ABNORMAL HIGH (ref 0.0–40.0)

## 2017-05-18 LAB — TSH: TSH: 2.91 u[IU]/mL (ref 0.35–4.50)

## 2017-05-18 LAB — HEMOGLOBIN A1C: HEMOGLOBIN A1C: 5.3 % (ref 4.6–6.5)

## 2017-05-18 LAB — LDL CHOLESTEROL, DIRECT: LDL DIRECT: 107 mg/dL

## 2017-05-18 NOTE — Progress Notes (Signed)
Subjective:  Patient ID: Tyler Griffin, male    DOB: 1981-02-05  Age: 36 y.o. MRN: [id number]  CC: Annual Exam and Hyperlipidemia   HPI Tyler Griffin presents for a CPX - he feels well and offers no complaints. He is working on his lifestyle modifications to lose weight.  Outpatient Medications Prior to Visit  Medication Sig Dispense Refill  . acetaminophen (TYLENOL) 500 MG tablet Take 500 mg by mouth every 6 (six) hours as needed.     No facility-administered medications prior to visit.     ROS Review of Systems  Constitutional: Negative.  Negative for diaphoresis and fatigue.  HENT: Negative.   Eyes: Negative for visual disturbance.  Respiratory: Negative.  Negative for cough, chest tightness, shortness of breath and wheezing.   Cardiovascular: Negative.  Negative for chest pain, palpitations and leg swelling.  Gastrointestinal: Negative for abdominal pain, constipation, diarrhea, nausea and vomiting.  Endocrine: Negative.   Genitourinary: Negative.  Negative for difficulty urinating, dysuria, penile swelling, scrotal swelling and testicular pain.  Musculoskeletal: Negative.   Skin: Negative.   Allergic/Immunologic: Negative.   Neurological: Negative.  Negative for dizziness, weakness and headaches.  Hematological: Negative for adenopathy. Does not bruise/bleed easily.  Psychiatric/Behavioral: Negative.     Objective:  BP 112/64 (BP Location: Left Arm, Patient Position: Sitting, Cuff Size: Large)   Pulse 80   Temp (!) 97.5 F (36.4 C) (Oral)   Resp 16   Ht 6' (1.829 m)   Wt 254 lb (115.2 kg)   SpO2 99%   BMI 34.45 kg/m   BP Readings from Last 3 Encounters:  05/18/17 112/64  03/16/17 131/73  02/17/17 126/77    Wt Readings from Last 3 Encounters:  05/18/17 254 lb (115.2 kg)  03/16/17 248 lb (112.5 kg)  02/17/17 254 lb (115.2 kg)    Physical Exam  Constitutional: He is oriented to person, place, and time. No distress.  HENT:  Mouth/Throat: Oropharynx is  clear and moist. No oropharyngeal exudate.  Eyes: Conjunctivae are normal. Right eye exhibits no discharge. Left eye exhibits no discharge. No scleral icterus.  Neck: Normal range of motion. Neck supple. No JVD present. No thyromegaly present.  Cardiovascular: Normal rate, regular rhythm and intact distal pulses.  Exam reveals no gallop.   No murmur heard. Pulmonary/Chest: Effort normal and breath sounds normal. No respiratory distress. He has no wheezes. He has no rales. He exhibits no tenderness.  Abdominal: Soft. Bowel sounds are normal. He exhibits no distension and no mass. There is no tenderness. There is no rebound and no guarding. Hernia confirmed negative in the right inguinal area and confirmed negative in the left inguinal area.  Genitourinary: Testes normal and penis normal. Right testis shows no mass, no swelling and no tenderness. Right testis is descended. Left testis shows no mass, no swelling and no tenderness. Left testis is descended. Circumcised. No penile erythema or penile tenderness. No discharge found.  Musculoskeletal: Normal range of motion. He exhibits no edema, tenderness or deformity.  Lymphadenopathy:    He has no cervical adenopathy.       Right: No inguinal adenopathy present.       Left: No inguinal adenopathy present.  Neurological: He is alert and oriented to person, place, and time.  Skin: Skin is warm and dry. No rash noted. He is not diaphoretic. No erythema. No pallor.  Psychiatric: He has a normal mood and affect. His behavior is normal. Judgment and thought content normal.  Vitals reviewed.  Lab Results  Component Value Date   GLUCOSE 107 (H) 05/18/2017   CHOL 191 05/18/2017   TRIG 399.0 (H) 05/18/2017   HDL 39.70 05/18/2017   LDLDIRECT 107.0 05/18/2017   ALT 20 05/18/2017   AST 14 05/18/2017   NA 137 05/18/2017   K 4.1 05/18/2017   CL 102 05/18/2017   CREATININE 0.81 05/18/2017   BUN 18 05/18/2017   CO2 28 05/18/2017   TSH 2.91 05/18/2017     HGBA1C 5.3 05/18/2017    No results found.  Assessment & Plan:   Tyler Griffin was seen today for annual exam and hyperlipidemia.  Diagnoses and all orders for this visit:  Routine general medical examination at a health care facility- Exam completed, labs ordered and reviewed, vaccines reviewed, patient education material was given. -     Lipid panel; Future  Obesity (BMI 30.0-34.9)- he is making progress with his lifestyle modifications. He has mild hyperglycemia but is not diabetic or prediabetic. His labs are negative for any secondary causes of obesity. -     Comprehensive metabolic panel; Future -     TSH; Future -     Hemoglobin A1c; Future  Pure hyperglyceridemia- his triglycerides are elevated at 399, in addition to dietary restrictions I have asked him to start taking an omega-3 fish oil to reduce the risk of complications such as pancreatitis. -     omega-3 acid ethyl esters (LOVAZA) 1 g capsule; Take 2 capsules (2 g total) by mouth 2 (two) times daily.   I am having Tyler Griffin start on omega-3 acid ethyl esters. I am also having him maintain his acetaminophen.  Meds ordered this encounter  Medications  . omega-3 acid ethyl esters (LOVAZA) 1 g capsule    Sig: Take 2 capsules (2 g total) by mouth 2 (two) times daily.    Dispense:  120 capsule    Refill:  11     Follow-up: Return if symptoms worsen or fail to improve.  Scarlette Calico, MD

## 2017-05-18 NOTE — Progress Notes (Signed)
Pre visit review using our clinic review tool, if applicable. No additional management support is needed unless otherwise documented below in the visit note. 

## 2017-05-18 NOTE — Patient Instructions (Signed)

## 2017-05-19 ENCOUNTER — Other Ambulatory Visit: Payer: Self-pay | Admitting: Internal Medicine

## 2017-05-19 DIAGNOSIS — E781 Pure hyperglyceridemia: Secondary | ICD-10-CM | POA: Insufficient documentation

## 2017-05-19 DIAGNOSIS — E785 Hyperlipidemia, unspecified: Secondary | ICD-10-CM | POA: Insufficient documentation

## 2017-05-19 MED ORDER — OMEGA-3-ACID ETHYL ESTERS 1 G PO CAPS: 2.0000 g | ORAL_CAPSULE | Freq: Two times a day (BID) | ORAL | 11 refills | Status: DC

## 2017-06-01 ENCOUNTER — Encounter: Payer: Self-pay | Admitting: Vascular Surgery

## 2017-06-16 ENCOUNTER — Encounter: Payer: Self-pay | Admitting: Vascular Surgery

## 2017-06-16 ENCOUNTER — Ambulatory Visit (INDEPENDENT_AMBULATORY_CARE_PROVIDER_SITE_OTHER): Payer: BLUE CROSS/BLUE SHIELD | Admitting: Vascular Surgery

## 2017-06-16 VITALS — BP 122/76 | HR 83 | Temp 97.8°F | Resp 18 | Ht 72.0 in | Wt 260.0 lb

## 2017-06-16 DIAGNOSIS — I83892 Varicose veins of left lower extremities with other complications: Secondary | ICD-10-CM

## 2017-06-16 NOTE — Progress Notes (Signed)
Subjective:     Patient ID: Tyler Griffin, male   DOB: 1981/06/15, 36 y.o.   MRN: [id number]  HPI This 36 year old male returns for 3 month follow-up regarding his pain and swelling in the left leg due to gross reflux in the left great saphenous vein. He is tried long-leg elastic compression stockings 20-30 millimeter gradient as well as elevation and ibuprofen but continues to have progressive swelling in the left leg particularly below the knee as the day progresses. He develops heavy aching discomfort in the left calf. This is affecting his daily living and continuing to worsen.  Past Medical History:  Diagnosis Date  . Anal fissure    underwent fissurotomy  . Hemorrhoids     Social History  Substance Use Topics  . Smoking status: Former Smoker    Packs/day: 0.50    Types: Cigars    Quit date: 11/13/2014  . Smokeless tobacco: Never Used     Comment: 1/2 pk  . Alcohol use Yes     Comment: 6 pack on weekends    Family History  Problem Relation Age of Onset  . Heart disease Father        CAD/CABG  . Colon cancer Other     No Known Allergies   Current Outpatient Prescriptions:  .  omega-3 acid ethyl esters (LOVAZA) 1 g capsule, Take 2 capsules (2 g total) by mouth 2 (two) times daily., Disp: 120 capsule, Rfl: 11 .  acetaminophen (TYLENOL) 500 MG tablet, Take 500 mg by mouth every 6 (six) hours as needed., Disp: , Rfl:   Vitals:   06/16/17 1301  BP: 122/76  Pulse: 83  Resp: 18  Temp: 97.8 F (36.6 C)  TempSrc: Oral  SpO2: 97%  Weight: 260 lb (117.9 kg)  Height: 6' (1.829 m)    Body mass index is 35.26 kg/m.         Review of Systems Denies chest pain, dyspnea on exertion, PND, orthopnea, hemoptysis    Objective:   Physical Exam BP 122/76 (BP Location: Left Arm, Patient Position: Sitting, Cuff Size: Large)   Pulse 83   Temp 97.8 F (36.6 C) (Oral)   Resp 18   Ht 6' (1.829 m)   Wt 260 lb (117.9 kg)   SpO2 97%   BMI 35.26 kg/m   Gen. well-developed  well-nourished male no apparent distress alert and oriented 3 Lungs no rhonchi or wheezing Left leg with edema and a prominent varicosity in the lower third of the left calf over the great saphenous vein. 1+ distal edema. No hyperpigmentation or ulceration noted. No edema in the contralateral right leg. Tenderness to deep palpation below the knee. 3+ to Sallis pedis pulse palpable.  I reviewed the reflux exam from 03/06/2017 which revealed gross reflux throughout and enlarged left great saphenous vein down to the proximal calf level supplying the small nest of varicosities in the medial calf.     Assessment:     Painful swelling in the left leg due to gross reflux left great saphenous vein causing symptoms which are affecting patient's daily living and resistant to conservative measures including long-leg elastic compression stockings, elevation, and ibuprofen    Plan:     Patient needs laser ablation left great saphenous vein to relieve his symptoms We will proceed with precertification to perform this in the near future

## 2017-06-17 ENCOUNTER — Other Ambulatory Visit: Payer: Self-pay | Admitting: *Deleted

## 2017-06-17 DIAGNOSIS — I83892 Varicose veins of left lower extremities with other complications: Secondary | ICD-10-CM

## 2017-07-27 ENCOUNTER — Ambulatory Visit (INDEPENDENT_AMBULATORY_CARE_PROVIDER_SITE_OTHER): Payer: BLUE CROSS/BLUE SHIELD | Admitting: Vascular Surgery

## 2017-07-27 ENCOUNTER — Encounter: Payer: Self-pay | Admitting: Vascular Surgery

## 2017-07-27 VITALS — BP 106/74 | HR 74 | Temp 97.2°F | Resp 18 | Ht 72.0 in | Wt 250.0 lb

## 2017-07-27 DIAGNOSIS — I83892 Varicose veins of left lower extremities with other complications: Secondary | ICD-10-CM | POA: Diagnosis not present

## 2017-07-27 HISTORY — PX: ENDOVENOUS ABLATION SAPHENOUS VEIN W/ LASER: SUR449

## 2017-07-27 NOTE — Progress Notes (Signed)
Subjective:     Patient ID: Tyler Griffin, male   DOB: 11/14/1980, 36 y.o.   MRN: [id number]  HPI This 36 year old male had laser ablation left great saphenous vein from the proximal calf to near the saphenofemoral junction performed under local tumescent anesthesia. A total of 2135 J of energy was utilized. He also had 3 stab phlebectomy of painful varicosities in the mid calf. He tolerated both procedures well.  Review of Systems     Objective:   Physical Exam BP 106/74 (BP Location: Left Arm, Patient Position: Sitting, Cuff Size: Large)   Pulse 74   Temp (!) 97.2 F (36.2 C) (Oral)   Resp 18   Ht 6' (1.829 m)   Wt 250 lb (113.4 kg)   SpO2 99%   BMI 33.91 kg/m       Assessment:     Well-tolerated laser ablation left great saphenous vein +3 stab phlebectomy of painful varicosities in the left mid calf performed under local tumescent anesthesia    Plan:     Return in 1 week for venous duplex exam to confirm closure left great saphenous vein and this will complete patient's treatment regimen

## 2017-07-27 NOTE — Progress Notes (Signed)
Laser Ablation Procedure    Date: 07/27/2017   Tyler Griffin DOB:[birth year]  Consent signed: Yes    Surgeon:  Dr. Nelda Severe. Kellie Simmering  Procedure: Laser Ablation: left Greater Saphenous Vein  BP 106/74 (BP Location: Left Arm, Patient Position: Sitting, Cuff Size: Large)   Pulse 74   Temp (!) 97.2 F (36.2 C) (Oral)   Resp 18   Ht 6' (1.829 m)   Wt 250 lb (113.4 kg)   SpO2 99%   BMI 33.91 kg/m   Tumescent Anesthesia: 475 cc 0.9% NaCl with 50 cc Lidocaine HCL with 1% Epi and 15 cc 8.4% NaHCO3  Local Anesthesia: 7 cc Lidocaine HCL and NaHCO3 (ratio 2:1)  Pulsed Mode: 15 watts, 542ms delay, 1.0 duration  Total Energy: 2173 Joules             Total Pulses:  147              Total Time: 2:25    Stab Phlebectomy: 3 Sites: Calf  Patient tolerated procedure well   Description of Procedure:  After marking the course of the secondary varicosities, the patient was placed on the operating table in the supine position, and the left leg was prepped and draped in sterile fashion.   Local anesthetic was administered and under ultrasound guidance the saphenous vein was accessed with a micro needle and guide wire; then the mirco puncture sheath was placed.  A guide wire was inserted saphenofemoral junction , followed by a 5 french sheath.  The position of the sheath and then the laser fiber below the junction was confirmed using the ultrasound.  Tumescent anesthesia was administered along the course of the saphenous vein using ultrasound guidance. The patient was placed in Trendelenburg position and protective laser glasses were placed on patient and staff, and the laser was fired at 15 watts continuous mode advancing 1-76mm/second for a total of 2173 joules.   For stab phlebectomies, local anesthetic was administered at the previously marked varicosities, and tumescent anesthesia was administered around the vessels.  Three stab wounds were made using the tip of an 11 blade. And using the vein hook, the  phlebectomies were performed using a hemostat to avulse the varicosities.  Adequate hemostasis was achieved.     Steri strips were applied to the stab phlebectomy sites and ABD pads and thigh high compression stockings were applied.  Ace wrap bandages were applied over the phlebectomy sites and at the top of the saphenofemoral junction. Blood loss was less than 15 cc.  The patient ambulated out of the operating room having tolerated the procedure well.

## 2017-07-28 ENCOUNTER — Encounter: Payer: Self-pay | Admitting: Vascular Surgery

## 2017-08-03 ENCOUNTER — Other Ambulatory Visit: Payer: BLUE CROSS/BLUE SHIELD | Admitting: Vascular Surgery

## 2017-08-05 ENCOUNTER — Ambulatory Visit (HOSPITAL_COMMUNITY)
Admission: RE | Admit: 2017-08-05 | Discharge: 2017-08-05 | Disposition: A | Discharge status: Home / Self Care | Payer: BLUE CROSS/BLUE SHIELD | Source: Ambulatory Visit | Attending: Vascular Surgery | Admitting: Vascular Surgery

## 2017-08-05 ENCOUNTER — Ambulatory Visit (INDEPENDENT_AMBULATORY_CARE_PROVIDER_SITE_OTHER): Payer: BLUE CROSS/BLUE SHIELD | Admitting: Vascular Surgery

## 2017-08-05 ENCOUNTER — Encounter: Payer: Self-pay | Admitting: Vascular Surgery

## 2017-08-05 VITALS — BP 123/76 | HR 69 | Temp 97.9°F | Resp 16 | Ht 72.0 in | Wt 252.0 lb

## 2017-08-05 DIAGNOSIS — Z9889 Other specified postprocedural states: Secondary | ICD-10-CM | POA: Insufficient documentation

## 2017-08-05 DIAGNOSIS — I83892 Varicose veins of left lower extremities with other complications: Secondary | ICD-10-CM | POA: Insufficient documentation

## 2017-08-05 NOTE — Progress Notes (Signed)
Subjective:     Patient ID: Tyler Griffin, male   DOB: [birth year], 36 y.o.   MRN: [id number]  HPI this 36 year old male returns 1 week post-laser ablation left great saphenous vein . Patient has had some mild-to-moderate discomfort in the distal thigh over the great saphenous vein near the flexion crease of the knee. He's had no distal edema. He has taken ibuprofen as instructed and worn his elastic compression stocking.  Past Medical History:  Diagnosis Date  . Anal fissure    underwent fissurotomy  . Hemorrhoids     Social History  Substance Use Topics  . Smoking status: Former Smoker    Packs/day: 0.50    Types: Cigars    Quit date: 11/13/2014  . Smokeless tobacco: Never Used     Comment: 1/2 pk  . Alcohol use Yes     Comment: 6 pack on weekends    Family History  Problem Relation Age of Onset  . Heart disease Father        CAD/CABG  . Colon cancer Other     No Known Allergies   Current Outpatient Prescriptions:  .  acetaminophen (TYLENOL) 500 MG tablet, Take 500 mg by mouth every 6 (six) hours as needed., Disp: , Rfl:  .  omega-3 acid ethyl esters (LOVAZA) 1 g capsule, Take 2 capsules (2 g total) by mouth 2 (two) times daily., Disp: 120 capsule, Rfl: 11  Vitals:   08/05/17 1036  BP: 123/76  Pulse: 69  Resp: 16  Temp: 97.9 F (36.6 C)  SpO2: 97%  Weight: 252 lb (114.3 kg)  Height: 6' (1.829 m)    Body mass index is 34.18 kg/m.        Review of Systems Denies chest pain, dyspnea on exertion, PND, orthopnea, hemoptysis, claudication    Objective:   Physical Exam BP 123/76   Pulse 69   Temp 97.9 F (36.6 C)   Resp 16   Ht 6' (1.829 m)   Wt 252 lb (114.3 kg)   SpO2 97%   BMI 34.18 kg/m   Gen. well-developed well-nourished male no apparent distress alert and oriented 3 Lungs no rhonchi or wheezing Cardiovascular regular rhythm no murmurs Left leg with mild discomfort along course of great saphenous vein down to proximal calf with no erythema or  blistering. No distal edema noted. 3 posterior cells pedis pulse palpable.   Today I ordered a venous duplex exam the left leg which I reviewed and interpreted. There is no DVT. There is total closure of the great saphenous vein from the midcalf to near the saphenofemoral junction     Assessment:     Successful laser ablation left great saphenous vein performed under local tumescent anesthesia for gross reflux with pain and swelling    Plan:     Patient return to see me on a when necessary basis

## 2017-08-10 ENCOUNTER — Encounter (HOSPITAL_COMMUNITY): Payer: BLUE CROSS/BLUE SHIELD

## 2017-08-10 ENCOUNTER — Ambulatory Visit: Payer: BLUE CROSS/BLUE SHIELD | Admitting: Vascular Surgery

## 2017-09-11 ENCOUNTER — Other Ambulatory Visit (INDEPENDENT_AMBULATORY_CARE_PROVIDER_SITE_OTHER): Payer: BLUE CROSS/BLUE SHIELD

## 2017-09-11 DIAGNOSIS — E781 Pure hyperglyceridemia: Secondary | ICD-10-CM | POA: Diagnosis not present

## 2017-09-11 LAB — TRIGLYCERIDES: Triglycerides: 167 mg/dL — ABNORMAL HIGH (ref 0.0–149.0)

## 2017-09-14 ENCOUNTER — Telehealth: Payer: Self-pay | Admitting: Internal Medicine

## 2017-09-14 DIAGNOSIS — E781 Pure hyperglyceridemia: Secondary | ICD-10-CM

## 2017-09-14 MED ORDER — OMEGA-3-ACID ETHYL ESTERS 1 G PO CAPS: 2.0000 g | ORAL_CAPSULE | Freq: Two times a day (BID) | ORAL | 11 refills | Status: DC

## 2017-09-14 MED FILL — OMEGA-3 ETHYL ESTERS 1 GM C: 1 | 30 days supply | Qty: 120 | Fill #0

## 2017-09-14 NOTE — Telephone Encounter (Signed)
Pt called and was given lab results and expressed understanding  He would like his [place] transferred to [place]  Please advise

## 2017-10-27 MED FILL — OMEGA-3 ETHYL ESTERS 1 GM C: 1 | 30 days supply | Qty: 120 | Fill #1

## 2017-12-10 MED FILL — OMEGA-3 ETHYL ESTERS 1 GM C: 1 | 30 days supply | Qty: 120 | Fill #2

## 2018-01-26 MED FILL — OMEGA-3 ETHYL ESTERS 1 GM C: 1 | 30 days supply | Qty: 120 | Fill #3

## 2018-03-10 MED FILL — OMEGA-3 ETHYL ESTERS 1 GM C: 1 | 30 days supply | Qty: 120 | Fill #4

## 2018-03-18 DIAGNOSIS — R079 Chest pain, unspecified: Secondary | ICD-10-CM | POA: Diagnosis not present

## 2018-03-18 DIAGNOSIS — R0789 Other chest pain: Secondary | ICD-10-CM | POA: Diagnosis not present

## 2018-03-18 DIAGNOSIS — R42 Dizziness and giddiness: Secondary | ICD-10-CM | POA: Diagnosis not present

## 2018-03-18 DIAGNOSIS — R0602 Shortness of breath: Secondary | ICD-10-CM | POA: Diagnosis not present

## 2018-03-19 ENCOUNTER — Encounter: Payer: Self-pay | Admitting: Internal Medicine

## 2018-03-19 ENCOUNTER — Ambulatory Visit: Payer: BLUE CROSS/BLUE SHIELD | Admitting: Internal Medicine

## 2018-03-19 VITALS — BP 110/70 | HR 77 | Temp 98.7°F | Ht 72.0 in | Wt 246.0 lb

## 2018-03-19 DIAGNOSIS — R079 Chest pain, unspecified: Secondary | ICD-10-CM | POA: Diagnosis not present

## 2018-03-19 DIAGNOSIS — K219 Gastro-esophageal reflux disease without esophagitis: Secondary | ICD-10-CM | POA: Insufficient documentation

## 2018-03-19 DIAGNOSIS — F410 Panic disorder [episodic paroxysmal anxiety] without agoraphobia: Secondary | ICD-10-CM | POA: Diagnosis not present

## 2018-03-19 MED ORDER — ALPRAZOLAM 0.25 MG PO TABS: 0.2500 mg | ORAL_TABLET | Freq: Two times a day (BID) | ORAL | 0 refills | Status: DC | PRN

## 2018-03-19 MED ORDER — PANTOPRAZOLE SODIUM 40 MG PO TBEC: 40.0000 mg | DELAYED_RELEASE_TABLET | Freq: Every day | ORAL | 3 refills | Status: DC

## 2018-03-19 NOTE — Progress Notes (Signed)
Subjective:    Patient ID: Tyler Griffin, male    DOB: Mar 02, 1981, 37 y.o.   MRN: [id number]  HPI  Here with episode anxiety and sob, seen at ED without specific findings, started yesterday about 11 am with some lower SSCP he thought was indigestion to start, proceeded with lunch but symptoms worse after lunch, admits he started worrying, had some nausea and dizziness, followed by sob, anxiety and ? Panic attack, had several waves of nausea. Has a family member with recent MI and started to think about this  Seen at ED at [place] yesterday where he travelled for work, ecg reportedly OK, then had troponins and cxr and repeat ecg - all results normal per pt.  Quit smoking x 2 rys, was smoking off an on for 10 yrs prior about 1 ppd with some intermittent quitting and chantix.  States no ETOH during the week, but will drink on weekends.  No illicit drugs. Has been doing well for work but with some increased responsibilities, lots of travel, has 2 young children with multiple URI and ear tubes in the past year, just moved into new home 1 yr ago after new marriage about 2 yrs ago.  [place] much for work, driving a lot, Has ongoing very mild reflux, had burp and belching some yesterday am that he attributes to that.  Denies worsening other abd pain, dysphagia, n/v, bowel change or blood. Now on keto diet, runs after 37 yo.to try to control weight, has been able to lose several lbs.  Wt Readings from Last 3 Encounters:  03/19/18 246 lb (111.6 kg)  08/05/17 252 lb (114.3 kg)  07/27/17 250 lb (113.4 kg)  Denies worsening depressive symptoms, suicidal ideation, or even significant anxiety disorder in the past.  Past Medical History:  Diagnosis Date  . Anal fissure    underwent fissurotomy  . Hemorrhoids    Past Surgical History:  Procedure Laterality Date  . ANAL FISSURE REPAIR  2009  . ENDOVENOUS ABLATION SAPHENOUS VEIN W/ LASER Left 07/27/2017   endovenous laser ablation L GSV and stab phlebectomy  < 10 incisions left leg   . EXCISIONAL HEMORRHOIDECTOMY  2008    reports that he quit smoking about 3 years ago. His smoking use included cigars. He smoked 0.50 packs per day. He has never used smokeless tobacco. He reports that he drinks alcohol. He reports that he does not use drugs. family history includes Colon cancer in his other; Heart disease in his father. No Known Allergies Current Outpatient Medications on File Prior to Visit  Medication Sig Dispense Refill  . acetaminophen (TYLENOL) 500 MG tablet Take 500 mg by mouth every 6 (six) hours as needed.    Marland Kitchen omega-3 acid ethyl esters (LOVAZA) 1 g capsule Take 2 capsules (2 g total) 2 (two) times daily by mouth. 120 capsule 11   No current facility-administered medications on file prior to visit.    Review of Systems  Constitutional: Negative for other unusual diaphoresis or sweats HENT: Negative for ear discharge or swelling Eyes: Negative for other worsening visual disturbances Respiratory: Negative for stridor or other swelling  Gastrointestinal: Negative for worsening distension or other blood Genitourinary: Negative for retention or other urinary change Musculoskeletal: Negative for other MSK pain or swelling Skin: Negative for color change or other new lesions Neurological: Negative for worsening tremors and other numbness  Psychiatric/Behavioral: Negative for worsening agitation or other fatigue \\All  other system neg per pt    Objective:  Physical Exam BP 110/70 (BP Location: Left Arm, Patient Position: Sitting, Cuff Size: Large)   Pulse 77   Temp 98.7 F (37.1 C) (Oral)   Ht 6' (1.829 m)   Wt 246 lb (111.6 kg)   SpO2 98%   BMI 33.36 kg/m  VS noted, not ill appearing, obese Constitutional: Pt appears in NAD HENT: Head: NCAT.  Right Ear: External ear normal.  Left Ear: External ear normal.  Eyes: . Pupils are equal, round, and reactive to light. Conjunctivae and EOM are normal Nose: without d/c or  deformity Neck: Neck supple. Gross normal ROM Cardiovascular: Normal rate and regular rhythm.   Pulmonary/Chest: Effort normal and breath sounds without rales or wheezing.  Abd:  Soft, NT, ND, + BS, no organomegaly Neurological: Pt is alert. At baseline orientation, motor grossly intact Skin: Skin is warm. No rashes, other new lesions, no LE edema Psychiatric: Pt behavior is normal without agitation, mild nervous   All other system neg per pt  Lab Results  Component Value Date   GLUCOSE 107 (H) 05/18/2017   CHOL 191 05/18/2017   TRIG 167.0 (H) 09/11/2017   HDL 39.70 05/18/2017   LDLDIRECT 107.0 05/18/2017   ALT 20 05/18/2017   AST 14 05/18/2017   NA 137 05/18/2017   K 4.1 05/18/2017   CL 102 05/18/2017   CREATININE 0.81 05/18/2017   BUN 18 05/18/2017   CO2 28 05/18/2017   TSH 2.91 05/18/2017   HGBA1C 5.3 05/18/2017       Assessment & Plan:

## 2018-03-19 NOTE — Patient Instructions (Addendum)
Please take all new medication as prescribed - the protonix as needed, and xanax as needed for any further attacks  Please continue all other medications as before, and refills have been done if requested.  Please have the pharmacy call with any other refills you may need.  Please continue your efforts at being more active, low cholesterol diet, and weight control.  Please keep your appointments with your specialists as you may have planned

## 2018-03-20 DIAGNOSIS — R079 Chest pain, unspecified: Secondary | ICD-10-CM | POA: Insufficient documentation

## 2018-03-20 NOTE — Assessment & Plan Note (Signed)
Mild intermittent, for diet and prn PPI

## 2018-03-20 NOTE — Assessment & Plan Note (Addendum)
Single episode yesterday, no evidence for chronic disorder or depression, for small rx xanax prn for future episode only, declines need for counseling referral, wife with him today very supportive

## 2018-03-20 NOTE — Assessment & Plan Note (Signed)
Very low suspicion for cardiac, atypical, likely GI related, will hold on further eval such as stress testing for now

## 2018-04-26 ENCOUNTER — Telehealth: Payer: Self-pay | Admitting: Internal Medicine

## 2018-04-26 NOTE — Telephone Encounter (Signed)
Copied from [place] [id number]. Topic: General - Other >> Apr 26, 2018  3:48 PM Cecelia Byars, NT wrote: Reason for CRM: Patient saw Dr Jenny Reichmann on 03/19/18 and was told if he needed something daily for anxiety to call back , please call him [phone]

## 2018-04-27 ENCOUNTER — Telehealth: Payer: Self-pay | Admitting: Internal Medicine

## 2018-04-27 MED ORDER — CITALOPRAM HYDROBROMIDE 20 MG PO TABS: 20.0000 mg | ORAL_TABLET | Freq: Every day | ORAL | 1 refills | Status: DC

## 2018-04-27 MED FILL — CITALOPRAM HBR 20 MG TABLET: 20 | 30 days supply | Qty: 30 | Fill #0

## 2018-04-27 NOTE — Telephone Encounter (Signed)
Pt has been informed, rx celexa has been sent in.

## 2018-04-27 NOTE — Telephone Encounter (Signed)
Ok for celexa 20 qd - done erx

## 2018-04-27 NOTE — Telephone Encounter (Signed)
Called pt and left a detailed message that Dr. Jenny Reichmann sent in rx for pt.

## 2018-04-27 NOTE — Telephone Encounter (Signed)
Copied from [place] [id number]. Topic: Quick Communication - See Telephone Encounter >> Apr 27, 2018  4:22 PM Cleaster Corin, NT wrote: CRM for notification. See Telephone encounter for: 04/27/18.  Pt. Calling to see if there is a medication that he can take everyday for his panic attacks. Pt. Can be reached at [phone]

## 2018-05-05 ENCOUNTER — Other Ambulatory Visit: Payer: Self-pay | Admitting: Internal Medicine

## 2018-05-06 MED FILL — OMEGA-3 ETHYL ESTERS 1 GM C: 1 | 30 days supply | Qty: 120 | Fill #5

## 2018-05-30 MED FILL — CITALOPRAM HBR 20 MG TABLET: 20 | 30 days supply | Qty: 30 | Fill #1

## 2018-06-24 MED FILL — CITALOPRAM HBR 20 MG TABLET: 20 | 30 days supply | Qty: 30 | Fill #2

## 2018-06-24 MED FILL — OMEGA-3 ETHYL ESTERS 1 GM C: 1 | 30 days supply | Qty: 120 | Fill #6

## 2018-07-28 MED FILL — CITALOPRAM HBR 20 MG TABLET: 20 | 30 days supply | Qty: 30 | Fill #3

## 2018-08-18 MED FILL — OMEGA-3 ETHYL ESTERS 1 GM C: 1 | 30 days supply | Qty: 120 | Fill #7

## 2018-08-26 MED FILL — CITALOPRAM HBR 20 MG TABLET: 20 | 30 days supply | Qty: 30 | Fill #4

## 2018-09-29 ENCOUNTER — Other Ambulatory Visit: Payer: Self-pay | Admitting: Internal Medicine

## 2018-09-29 ENCOUNTER — Telehealth: Payer: Self-pay | Admitting: Internal Medicine

## 2018-09-29 DIAGNOSIS — E781 Pure hyperglyceridemia: Secondary | ICD-10-CM

## 2018-09-29 MED FILL — CITALOPRAM HBR 20 MG TABLET: 20 | 30 days supply | Qty: 30 | Fill #5

## 2018-09-29 NOTE — Telephone Encounter (Signed)
Copied from [place] [id number]. Topic: General - Other >> Sep 29, 2018 12:33 PM Yvette Rack wrote: Reason for CRM: pt is requesting to be switch from Scarlette Calico to Cathlean Cower he states that his father is Johns pt and would like to be seen by him since its a family history heart diease  his father is Clance Boll f [place]

## 2018-09-29 NOTE — Telephone Encounter (Signed)
Yes, this is okay with me 

## 2018-09-29 NOTE — Telephone Encounter (Signed)
Ok with me 

## 2018-09-29 NOTE — Telephone Encounter (Signed)
Patient is requesting to transfer care from Dr Scarlette Calico to Dr Cathlean Cower (see below). Dr. Ronnald Ramp, is this okay with you? Dr. Jenny Reichmann, is this okay with you?

## 2018-09-30 NOTE — Telephone Encounter (Signed)
Left message informing patient. Okay to schedule with Dr Jenny Reichmann.  OFFICE VISIT - with note: Transfer from Dr Jones/ okay per Dr Jenny Reichmann

## 2018-10-01 ENCOUNTER — Other Ambulatory Visit: Payer: Self-pay | Admitting: Internal Medicine

## 2018-10-01 DIAGNOSIS — E781 Pure hyperglyceridemia: Secondary | ICD-10-CM

## 2018-10-01 NOTE — Telephone Encounter (Signed)
Copied from [place] [id number]. Topic: Quick Communication - Rx Refill/Question >> Oct 01, 2018  1:58 PM Scherrie Gerlach wrote: Medication: omega-3 acid ethyl esters (LOVAZA) 1 g capsule Pt has appt with Dr Ronnald Ramp on 12/16, but hoping to get this refill . Dr Ronnald Ramp has never written this for the pt. [id number] - [place], [place] (Phone) [phone] (Fax)

## 2018-10-01 NOTE — Telephone Encounter (Signed)
Requested medication (s) are due for refill today: yes  Requested medication (s) are on the active medication list: yes  Last refill:  09/23/17 Expired  Future visit scheduled: yes  Notes to clinic:      Requested Prescriptions  Pending Prescriptions Disp Refills   omega-3 acid ethyl esters (LOVAZA) 1 g capsule 120 capsule 11    Sig: Take 2 capsules (2 g total) by mouth 2 (two) times daily.     Endocrinology:  Nutritional Agents Failed - 10/01/2018  2:59 PM      Failed - Valid encounter within last 12 months    Recent Outpatient Visits          6 months ago Panic attack   [place], James W, MD   1 year ago Routine general medical examination at a health care facility   [place], MD   1 year ago Varicose veins of left lower extremity with complications   [place], Thomas L, MD   3 years ago Calf pain, left   [place], Olevia Bowens, DO   3 years ago [place], FNP      Future Appointments            In 3 weeks Ronnald Ramp Arvid Right, MD [place], [facility]

## 2018-10-04 MED ORDER — OMEGA-3-ACID ETHYL ESTERS 1 G PO CAPS: 2.0000 g | ORAL_CAPSULE | Freq: Two times a day (BID) | ORAL | 0 refills | Status: DC

## 2018-10-25 ENCOUNTER — Ambulatory Visit: Payer: BLUE CROSS/BLUE SHIELD | Admitting: Internal Medicine

## 2018-10-26 ENCOUNTER — Other Ambulatory Visit: Payer: Self-pay | Admitting: Internal Medicine

## 2018-10-26 DIAGNOSIS — E781 Pure hyperglyceridemia: Secondary | ICD-10-CM

## 2018-10-29 ENCOUNTER — Encounter: Payer: Self-pay | Admitting: Internal Medicine

## 2018-10-29 ENCOUNTER — Ambulatory Visit (INDEPENDENT_AMBULATORY_CARE_PROVIDER_SITE_OTHER): Payer: BLUE CROSS/BLUE SHIELD | Admitting: Internal Medicine

## 2018-10-29 VITALS — BP 122/84 | HR 93 | Temp 98.4°F | Ht 72.0 in | Wt 269.0 lb

## 2018-10-29 DIAGNOSIS — K219 Gastro-esophageal reflux disease without esophagitis: Secondary | ICD-10-CM | POA: Diagnosis not present

## 2018-10-29 DIAGNOSIS — R739 Hyperglycemia, unspecified: Secondary | ICD-10-CM

## 2018-10-29 DIAGNOSIS — F410 Panic disorder [episodic paroxysmal anxiety] without agoraphobia: Secondary | ICD-10-CM

## 2018-10-29 DIAGNOSIS — E781 Pure hyperglyceridemia: Secondary | ICD-10-CM

## 2018-10-29 DIAGNOSIS — Z0000 Encounter for general adult medical examination without abnormal findings: Secondary | ICD-10-CM

## 2018-10-29 MED ORDER — CITALOPRAM HYDROBROMIDE 20 MG PO TABS: 20.0000 mg | ORAL_TABLET | Freq: Every day | ORAL | 3 refills | Status: DC

## 2018-10-29 MED ORDER — ALPRAZOLAM 0.25 MG PO TABS: 0.2500 mg | ORAL_TABLET | Freq: Two times a day (BID) | ORAL | 1 refills | Status: DC | PRN

## 2018-10-29 MED ORDER — PANTOPRAZOLE SODIUM 40 MG PO TBEC: 40.0000 mg | DELAYED_RELEASE_TABLET | Freq: Every day | ORAL | 3 refills | Status: DC

## 2018-10-29 MED ORDER — OMEGA-3-ACID ETHYL ESTERS 1 G PO CAPS: 2.0000 g | ORAL_CAPSULE | Freq: Two times a day (BID) | ORAL | 3 refills | Status: DC

## 2018-10-29 NOTE — Progress Notes (Signed)
Subjective:    Patient ID: Tyler Griffin, male    DOB: 27-Nov-1980, 37 y.o.   MRN: [id number]  HPI  Here for yearly f/u;  Overall doing ok;  Pt denies Chest pain, worsening SOB, DOE, wheezing, orthopnea, PND, worsening LE edema, palpitations, dizziness or syncope.  Pt denies neurological change such as new headache, facial or extremity weakness.  Pt denies polydipsia, polyuria, or low sugar symptoms. Pt states overall good compliance with treatment and medications, good tolerability, and has been trying to follow appropriate diet.  Pt denies worsening depressive symptoms, suicidal ideation or panic, and have improved anxiety control.  . No fever, night sweats, wt loss, loss of appetite, or other constitutional symptoms.  Pt states good ability with ADL's, has low fall risk, home safety reviewed and adequate, no other significant changes in hearing or vision, and only occasionally active with exercise. Celexa working well, only takes very rare xanax.  Had lost about 15 lbs with keto diet and unfortun regained.  Denies worsening reflux, abd pain, dysphagia, n/v, bowel change or blood. Past Medical History:  Diagnosis Date  . Anal fissure    underwent fissurotomy  . Hemorrhoids    Past Surgical History:  Procedure Laterality Date  . ANAL FISSURE REPAIR  2009  . ENDOVENOUS ABLATION SAPHENOUS VEIN W/ LASER Left 07/27/2017   endovenous laser ablation L GSV and stab phlebectomy < 10 incisions left leg   . EXCISIONAL HEMORRHOIDECTOMY  2008    reports that he quit smoking about 3 years ago. His smoking use included cigars. He smoked 0.50 packs per day. He has never used smokeless tobacco. He reports current alcohol use. He reports that he does not use drugs. family history includes Colon cancer in an other family member; Heart disease in his father. No Known Allergies Current Outpatient Medications on File Prior to Visit  Medication Sig Dispense Refill  . acetaminophen (TYLENOL) 500 MG tablet Take 500  mg by mouth every 6 (six) hours as needed.     No current facility-administered medications on file prior to visit.    Review of Systems  Constitutional: Negative for other unusual diaphoresis or sweats HENT: Negative for ear discharge or swelling Eyes: Negative for other worsening visual disturbances Respiratory: Negative for stridor or other swelling  Gastrointestinal: Negative for worsening distension or other blood Genitourinary: Negative for retention or other urinary change Musculoskeletal: Negative for other MSK pain or swelling Skin: Negative for color change or other new lesions Neurological: Negative for worsening tremors and other numbness  Psychiatric/Behavioral: Negative for worsening agitation or other fatigue All other system neg per pt    Objective:   Physical Exam BP 122/84   Pulse 93   Temp 98.4 F (36.9 C) (Oral)   Ht 6' (1.829 m)   Wt 269 lb (122 kg)   SpO2 94%   BMI 36.48 kg/m  VS noted,  Constitutional: Pt appears in NAD HENT: Head: NCAT.  Right Ear: External ear normal.  Left Ear: External ear normal.  Eyes: . Pupils are equal, round, and reactive to light. Conjunctivae and EOM are normal Nose: without d/c or deformity Neck: Neck supple. Gross normal ROM Cardiovascular: Normal rate and regular rhythm.   Pulmonary/Chest: Effort normal and breath sounds without rales or wheezing.  Abd:  Soft, NT, ND, + BS, no organomegaly Neurological: Pt is alert. At baseline orientation, motor grossly intact Skin: Skin is warm. No rashes, other new lesions, no LE edema Psychiatric: Pt behavior is normal without  agitation , mild nervous Lab Results  Component Value Date   GLUCOSE 107 (H) 05/18/2017   CHOL 191 05/18/2017   TRIG 167.0 (H) 09/11/2017   HDL 39.70 05/18/2017   LDLDIRECT 107.0 05/18/2017   ALT 20 05/18/2017   AST 14 05/18/2017   NA 137 05/18/2017   K 4.1 05/18/2017   CL 102 05/18/2017   CREATININE 0.81 05/18/2017   BUN 18 05/18/2017   CO2 28  05/18/2017   TSH 2.91 05/18/2017   HGBA1C 5.3 05/18/2017       Assessment & Plan:

## 2018-10-29 NOTE — Patient Instructions (Signed)
Please continue all other medications as before, and refills have been done if requested.  Please have the pharmacy call with any other refills you may need.  Please continue your efforts at being more active, low cholesterol diet, and weight control.  You are otherwise up to date with prevention measures today.  Please keep your appointments with your specialists as you may have planned  Please return in 6 months, or sooner if needed, with Lab testing done 3-5 days before  

## 2018-10-30 NOTE — Assessment & Plan Note (Signed)
Improved and stable overall by history and exam, recent data reviewed with pt, and pt to continue medical treatment as before,  to f/u any worsening symptoms or concerns  

## 2018-10-30 NOTE — Assessment & Plan Note (Signed)
stable overall by history and exam, recent data reviewed with pt, and pt to continue medical treatment as before,  to f/u any worsening symptoms or concerns  

## 2018-10-30 NOTE — Assessment & Plan Note (Signed)
Lab Results  Component Value Date   CHOL 191 05/18/2017   HDL 39.70 05/18/2017   LDLDIRECT 107.0 05/18/2017   TRIG 167.0 (H) 09/11/2017   CHOLHDL 5 05/18/2017   For lower fat diet, wt control

## 2018-12-01 DIAGNOSIS — J0140 Acute pansinusitis, unspecified: Secondary | ICD-10-CM | POA: Diagnosis not present

## 2019-04-26 ENCOUNTER — Other Ambulatory Visit: Payer: Self-pay | Admitting: Internal Medicine

## 2019-04-26 DIAGNOSIS — E781 Pure hyperglyceridemia: Secondary | ICD-10-CM

## 2019-10-30 ENCOUNTER — Other Ambulatory Visit: Payer: Self-pay | Admitting: Internal Medicine

## 2019-10-30 DIAGNOSIS — E781 Pure hyperglyceridemia: Secondary | ICD-10-CM

## 2019-11-02 ENCOUNTER — Other Ambulatory Visit: Payer: Self-pay | Admitting: Internal Medicine

## 2019-11-06 ENCOUNTER — Other Ambulatory Visit: Payer: Self-pay | Admitting: Internal Medicine

## 2019-11-06 DIAGNOSIS — E781 Pure hyperglyceridemia: Secondary | ICD-10-CM

## 2019-11-28 ENCOUNTER — Other Ambulatory Visit: Payer: Self-pay | Admitting: Internal Medicine

## 2020-01-10 DIAGNOSIS — B353 Tinea pedis: Secondary | ICD-10-CM | POA: Diagnosis not present

## 2020-01-10 DIAGNOSIS — M2042 Other hammer toe(s) (acquired), left foot: Secondary | ICD-10-CM | POA: Diagnosis not present

## 2020-01-20 DIAGNOSIS — B351 Tinea unguium: Secondary | ICD-10-CM | POA: Diagnosis not present

## 2020-01-24 ENCOUNTER — Other Ambulatory Visit: Payer: Self-pay | Admitting: Internal Medicine

## 2020-01-24 NOTE — Telephone Encounter (Signed)
Please refill as per office routine med refill policy (all routine meds refilled for 3 mo or monthly per pt preference up to one year from last visit, then month to month grace period for 3 mo, then further med refills will have to be denied)  

## 2020-01-26 ENCOUNTER — Encounter: Payer: Self-pay | Admitting: Internal Medicine

## 2020-01-26 ENCOUNTER — Other Ambulatory Visit: Payer: Self-pay

## 2020-01-26 ENCOUNTER — Ambulatory Visit (INDEPENDENT_AMBULATORY_CARE_PROVIDER_SITE_OTHER): Payer: BC Managed Care – PPO | Admitting: Internal Medicine

## 2020-01-26 VITALS — BP 122/80 | HR 95 | Temp 98.4°F | Ht 72.0 in | Wt 271.6 lb

## 2020-01-26 DIAGNOSIS — R739 Hyperglycemia, unspecified: Secondary | ICD-10-CM | POA: Insufficient documentation

## 2020-01-26 DIAGNOSIS — E559 Vitamin D deficiency, unspecified: Secondary | ICD-10-CM | POA: Diagnosis not present

## 2020-01-26 DIAGNOSIS — K219 Gastro-esophageal reflux disease without esophagitis: Secondary | ICD-10-CM

## 2020-01-26 DIAGNOSIS — E781 Pure hyperglyceridemia: Secondary | ICD-10-CM

## 2020-01-26 DIAGNOSIS — Z0000 Encounter for general adult medical examination without abnormal findings: Secondary | ICD-10-CM

## 2020-01-26 DIAGNOSIS — F419 Anxiety disorder, unspecified: Secondary | ICD-10-CM

## 2020-01-26 DIAGNOSIS — E538 Deficiency of other specified B group vitamins: Secondary | ICD-10-CM

## 2020-01-26 LAB — HEPATIC FUNCTION PANEL
ALT: 48 U/L (ref 0–53)
AST: 29 U/L (ref 0–37)
Albumin: 4.9 g/dL (ref 3.5–5.2)
Alkaline Phosphatase: 94 U/L (ref 39–117)
Bilirubin, Direct: 0.1 mg/dL (ref 0.0–0.3)
Total Bilirubin: 0.7 mg/dL (ref 0.2–1.2)
Total Protein: 7.9 g/dL (ref 6.0–8.3)

## 2020-01-26 LAB — CBC WITH DIFFERENTIAL/PLATELET
Basophils Absolute: 0 10*3/uL (ref 0.0–0.1)
Basophils Relative: 0.6 % (ref 0.0–3.0)
Eosinophils Absolute: 0.1 10*3/uL (ref 0.0–0.7)
Eosinophils Relative: 2 % (ref 0.0–5.0)
HCT: 47.3 % (ref 39.0–52.0)
Hemoglobin: 16 g/dL (ref 13.0–17.0)
Lymphocytes Relative: 25.4 % (ref 12.0–46.0)
Lymphs Abs: 1.8 10*3/uL (ref 0.7–4.0)
MCHC: 34 g/dL (ref 30.0–36.0)
MCV: 86.8 fl (ref 78.0–100.0)
Monocytes Absolute: 0.5 10*3/uL (ref 0.1–1.0)
Monocytes Relative: 7.6 % (ref 3.0–12.0)
Neutro Abs: 4.5 10*3/uL (ref 1.4–7.7)
Neutrophils Relative %: 64.4 % (ref 43.0–77.0)
Platelets: 226 10*3/uL (ref 150.0–400.0)
RBC: 5.44 Mil/uL (ref 4.22–5.81)
RDW: 13 % (ref 11.5–15.5)
WBC: 7 10*3/uL (ref 4.0–10.5)

## 2020-01-26 LAB — TSH: TSH: 1.78 u[IU]/mL (ref 0.35–4.50)

## 2020-01-26 LAB — BASIC METABOLIC PANEL
BUN: 15 mg/dL (ref 6–23)
CO2: 30 mEq/L (ref 19–32)
Calcium: 9.9 mg/dL (ref 8.4–10.5)
Chloride: 99 mEq/L (ref 96–112)
Creatinine, Ser: 0.76 mg/dL (ref 0.40–1.50)
GFR: 115.41 mL/min (ref >60.00)
Glucose, Bld: 99 mg/dL (ref 70–99)
Potassium: 4 mEq/L (ref 3.5–5.1)
Sodium: 136 mEq/L (ref 135–145)

## 2020-01-26 LAB — URINALYSIS, ROUTINE W REFLEX MICROSCOPIC
Bilirubin Urine: NEGATIVE
Hgb urine dipstick: NEGATIVE
Ketones, ur: 15 — AB
Leukocytes,Ua: NEGATIVE
Nitrite: NEGATIVE
RBC / HPF: NONE SEEN (ref 0–?)
Specific Gravity, Urine: 1.015 (ref 1.000–1.030)
Total Protein, Urine: 30 — AB
Urine Glucose: NEGATIVE
Urobilinogen, UA: 0.2 (ref 0.0–1.0)
WBC, UA: NONE SEEN (ref 0–?)
pH: 7.5 (ref 5.0–8.0)

## 2020-01-26 LAB — LIPID PANEL
Cholesterol: 235 mg/dL — ABNORMAL HIGH (ref 0–200)
HDL: 35.2 mg/dL — ABNORMAL LOW (ref >39.00)
LDL Cholesterol: 165 mg/dL — ABNORMAL HIGH (ref 0–99)
NonHDL: 200.17
Total CHOL/HDL Ratio: 7
Triglycerides: 174 mg/dL — ABNORMAL HIGH (ref 0.0–149.0)
VLDL: 34.8 mg/dL (ref 0.0–40.0)

## 2020-01-26 LAB — HEMOGLOBIN A1C: Hgb A1c MFr Bld: 5.5 % (ref 4.6–6.5)

## 2020-01-26 LAB — VITAMIN B12: Vitamin B-12: 315 pg/mL (ref 211–911)

## 2020-01-26 LAB — VITAMIN D 25 HYDROXY (VIT D DEFICIENCY, FRACTURES): VITD: 25.84 ng/mL — ABNORMAL LOW (ref 30.00–100.00)

## 2020-01-26 MED ORDER — OMEGA-3-ACID ETHYL ESTERS 1 G PO CAPS: 2.0000 g | ORAL_CAPSULE | Freq: Two times a day (BID) | ORAL | 3 refills | Status: DC

## 2020-01-26 MED ORDER — PANTOPRAZOLE SODIUM 40 MG PO TBEC: 40.0000 mg | DELAYED_RELEASE_TABLET | Freq: Every day | ORAL | 3 refills | Status: DC

## 2020-01-26 NOTE — Progress Notes (Signed)
Subjective:    Patient ID: Tyler Griffin, male    DOB: Nov 25, 1980, 39 y.o.   MRN: [id number]  HPI  Here for wellness and f/u;  Overall doing ok;  Pt denies Chest pain, worsening SOB, DOE, wheezing, orthopnea, PND, worsening LE edema, palpitations, dizziness or syncope.  Pt denies neurological change such as new headache, facial or extremity weakness.  Pt denies polydipsia, polyuria, or low sugar symptoms. Pt states overall good compliance with treatment and medications, good tolerability, and has been trying to follow appropriate diet.  Pt denies worsening depressive symptoms, suicidal ideation or panic. No fever, night sweats, wt loss, loss of appetite, or other constitutional symptoms.  Pt states good ability with ADL's, has low fall risk, home safety reviewed and adequate, no other significant changes in hearing or vision, and only occasionally active with exercise. Wt Readings from Last 3 Encounters:  01/26/20 271 lb 9.6 oz (123.2 kg)  10/29/18 269 lb (122 kg)  03/19/18 246 lb (111.6 kg)  Denies worsening reflux, abd pain, dysphagia, n/v, bowel change or blood.  No longer panic attacks, and felt slowed down on the med so stopped about 1 mo, more energy now.  Thinking to stay fof for now the celexa. Now more energy and wanting o go back to the gym, more motivated it seems.    Past Medical History:  Diagnosis Date  . Anal fissure    underwent fissurotomy  . Hemorrhoids    Past Surgical History:  Procedure Laterality Date  . ANAL FISSURE REPAIR  2009  . ENDOVENOUS ABLATION SAPHENOUS VEIN W/ LASER Left 07/27/2017   endovenous laser ablation L GSV and stab phlebectomy < 10 incisions left leg   . EXCISIONAL HEMORRHOIDECTOMY  2008    reports that he quit smoking about 5 years ago. His smoking use included cigars. He smoked 0.50 packs per day. He has never used smokeless tobacco. He reports current alcohol use. He reports that he does not use drugs. family history includes Colon cancer in an  other family member; Heart disease in his father. No Known Allergies Current Outpatient Medications on File Prior to Visit  Medication Sig Dispense Refill  . acetaminophen (TYLENOL) 500 MG tablet Take 500 mg by mouth every 6 (six) hours as needed.    . ALPRAZolam (XANAX) 0.25 MG tablet Take 1 tablet (0.25 mg total) by mouth 2 (two) times daily as needed for anxiety. (Patient not taking: Reported on 01/26/2020) 30 tablet 1   No current facility-administered medications on file prior to visit.   Review of Systems All otherwise neg per pt     Objective:   Physical Exam BP 122/80   Pulse 95   Temp 98.4 F (36.9 C)   Ht 6' (1.829 m)   Wt 271 lb 9.6 oz (123.2 kg)   SpO2 98%   BMI 36.84 kg/m  VS noted,  Constitutional: Pt appears in NAD HENT: Head: NCAT.  Right Ear: External ear normal.  Left Ear: External ear normal.  Eyes: . Pupils are equal, round, and reactive to light. Conjunctivae and EOM are normal Nose: without d/c or deformity Neck: Neck supple. Gross normal ROM Cardiovascular: Normal rate and regular rhythm.   Pulmonary/Chest: Effort normal and breath sounds without rales or wheezing.  Abd:  Soft, NT, ND, + BS, no organomegaly Neurological: Pt is alert. At baseline orientation, motor grossly intact Skin: Skin is warm. No rashes, other new lesions, no LE edema Psychiatric: Pt behavior is normal without agitation ,  mild nervous Lab Results  Component Value Date   WBC 7.0 01/26/2020   HGB 16.0 01/26/2020   HCT 47.3 01/26/2020   PLT 226.0 01/26/2020   GLUCOSE 99 01/26/2020   CHOL 235 (H) 01/26/2020   TRIG 174.0 (H) 01/26/2020   HDL 35.20 (L) 01/26/2020   LDLDIRECT 107.0 05/18/2017   LDLCALC 165 (H) 01/26/2020   ALT 48 01/26/2020   AST 29 01/26/2020   NA 136 01/26/2020   K 4.0 01/26/2020   CL 99 01/26/2020   CREATININE 0.76 01/26/2020   BUN 15 01/26/2020   CO2 30 01/26/2020   TSH 1.78 01/26/2020   HGBA1C 5.5 01/26/2020      Assessment & Plan:

## 2020-01-26 NOTE — Assessment & Plan Note (Signed)
stable overall by history and exam, recent data reviewed with pt, and pt to continue medical treatment as before,  to f/u any worsening symptoms or concerns  

## 2020-01-26 NOTE — Assessment & Plan Note (Signed)

## 2020-01-26 NOTE — Patient Instructions (Signed)

## 2020-01-27 ENCOUNTER — Other Ambulatory Visit: Payer: Self-pay | Admitting: Internal Medicine

## 2020-01-27 MED ORDER — VITAMIN D (ERGOCALCIFEROL) 1.25 MG (50000 UNIT) PO CAPS: 50000.0000 [IU] | ORAL_CAPSULE | ORAL | 0 refills | Status: DC

## 2020-04-16 ENCOUNTER — Other Ambulatory Visit: Payer: Self-pay | Admitting: Internal Medicine

## 2020-04-16 NOTE — Telephone Encounter (Signed)
Please let pt know to change to OTC Vitamin D3 at 2000 units per day, indefinitely.  

## 2020-04-18 NOTE — Telephone Encounter (Signed)
Spoke with pt and informed him to start an OTC vit D3 at 2000 units a day.

## 2020-07-19 ENCOUNTER — Ambulatory Visit: Payer: BC Managed Care – PPO | Admitting: Internal Medicine

## 2020-07-20 ENCOUNTER — Ambulatory Visit: Payer: BC Managed Care – PPO | Admitting: Internal Medicine

## 2020-07-20 DIAGNOSIS — M5136 Other intervertebral disc degeneration, lumbar region: Secondary | ICD-10-CM | POA: Diagnosis not present

## 2020-07-20 DIAGNOSIS — M545 Low back pain: Secondary | ICD-10-CM | POA: Diagnosis not present

## 2021-01-02 DIAGNOSIS — H40053 Ocular hypertension, bilateral: Secondary | ICD-10-CM | POA: Diagnosis not present

## 2021-01-07 ENCOUNTER — Other Ambulatory Visit: Payer: Self-pay

## 2021-01-08 ENCOUNTER — Ambulatory Visit: Payer: BC Managed Care – PPO | Admitting: Internal Medicine

## 2021-01-08 ENCOUNTER — Encounter: Payer: Self-pay | Admitting: Internal Medicine

## 2021-01-08 ENCOUNTER — Other Ambulatory Visit: Payer: Self-pay

## 2021-01-08 VITALS — BP 126/78 | HR 88 | Temp 98.4°F | Ht 72.0 in | Wt 268.0 lb

## 2021-01-08 DIAGNOSIS — J029 Acute pharyngitis, unspecified: Secondary | ICD-10-CM

## 2021-01-08 DIAGNOSIS — E785 Hyperlipidemia, unspecified: Secondary | ICD-10-CM

## 2021-01-08 DIAGNOSIS — R1310 Dysphagia, unspecified: Secondary | ICD-10-CM | POA: Insufficient documentation

## 2021-01-08 DIAGNOSIS — E781 Pure hyperglyceridemia: Secondary | ICD-10-CM

## 2021-01-08 DIAGNOSIS — J309 Allergic rhinitis, unspecified: Secondary | ICD-10-CM

## 2021-01-08 DIAGNOSIS — E559 Vitamin D deficiency, unspecified: Secondary | ICD-10-CM | POA: Diagnosis not present

## 2021-01-08 DIAGNOSIS — K219 Gastro-esophageal reflux disease without esophagitis: Secondary | ICD-10-CM

## 2021-01-08 DIAGNOSIS — Z0001 Encounter for general adult medical examination with abnormal findings: Secondary | ICD-10-CM

## 2021-01-08 DIAGNOSIS — Z1159 Encounter for screening for other viral diseases: Secondary | ICD-10-CM

## 2021-01-08 DIAGNOSIS — R739 Hyperglycemia, unspecified: Secondary | ICD-10-CM

## 2021-01-08 DIAGNOSIS — F419 Anxiety disorder, unspecified: Secondary | ICD-10-CM

## 2021-01-08 MED ORDER — PANTOPRAZOLE SODIUM 40 MG PO TBEC: 40.0000 mg | DELAYED_RELEASE_TABLET | Freq: Every day | ORAL | 3 refills | Status: DC

## 2021-01-08 MED ORDER — OMEGA-3-ACID ETHYL ESTERS 1 G PO CAPS: 2.0000 g | ORAL_CAPSULE | Freq: Two times a day (BID) | ORAL | 3 refills | Status: DC

## 2021-01-08 MED ORDER — ALPRAZOLAM 0.25 MG PO TABS: 0.2500 mg | ORAL_TABLET | Freq: Two times a day (BID) | ORAL | 1 refills | Status: DC | PRN

## 2021-01-08 NOTE — Progress Notes (Signed)
Patient ID: BRONSON BRESSMAN, male   DOB: September 18, 1981, 40 y.o.   MRN: [id number]         Chief Complaint::  Sore Throat , allergies, anxiety, and low vit d       HPI:  TULLY MCINTURFF is a 40 y.o. male here with Does have several wks ongoing nasal allergy symptoms with clearish congestion, ST, itch and sneezing, without fever, pain, cough, swelling or wheezing.  Unfortunately he spente the last wk of jan 2022 with covid infection, along with rest of family.  Still with some nasal congestion from that as well, and did have a nose bleed 1 day first wk of feb, left nares most likely it seems, but still the last 2 wks without pain or feels overall ok but has dry feeling and feels like something some stuck in the throat, no problems with eating and drinking, has to swallow freq during the day, and just drives him crazy .  States has no pain to swallow, no fever, Denies worsening reflux, abd pain, dysphagia, n/v, bowel change or blood.  Has taken flonase consistently for about 2 yrs, and has hx of very minor nasal bleeding in the past.  Has stopped flonase in the past 2 wks Denies worsening depressive symptoms, suicidal ideation, or panic; has ongoing anxiety.  Not taking Vit d  Wt Readings from Last 3 Encounters:  01/08/21 268 lb (121.6 kg)  01/26/20 271 lb 9.6 oz (123.2 kg)  10/29/18 269 lb (122 kg)   BP Readings from Last 3 Encounters:  01/08/21 126/78  01/26/20 122/80  10/29/18 122/84   Immunization History  Administered Date(s) Administered  . Influenza,inj,Quad PF,6+ Mos 11/20/2020  . Influenza-Unspecified Aug 30, 1981  . PFIZER(Purple Top)SARS-COV-2 Vaccination 04/12/2019, 03/12/2020, 11/20/2020  . Tdap 03/08/2015   There are no preventive care reminders to display for this patient.    Past Medical History:  Diagnosis Date  . Anal fissure    underwent fissurotomy  . Hemorrhoids    Past Surgical History:  Procedure Laterality Date  . ANAL FISSURE REPAIR  2009  . ENDOVENOUS ABLATION  SAPHENOUS VEIN W/ LASER Left 07/27/2017   endovenous laser ablation L GSV and stab phlebectomy < 10 incisions left leg   . EXCISIONAL HEMORRHOIDECTOMY  2008    reports that he quit smoking about 6 years ago. His smoking use included cigars. He smoked 0.50 packs per day. He has never used smokeless tobacco. He reports current alcohol use. He reports that he does not use drugs. family history includes Colon cancer in an other family member; Heart disease in his father. No Known Allergies Current Outpatient Medications on File Prior to Visit  Medication Sig Dispense Refill  . acetaminophen (TYLENOL) 500 MG tablet Take 500 mg by mouth every 6 (six) hours as needed.     No current facility-administered medications on file prior to visit.        ROS:  All others reviewed and negative.  Objective        PE:  BP 126/78   Pulse 88   Temp 98.4 F (36.9 C) (Oral)   Ht 6' (1.829 m)   Wt 268 lb (121.6 kg)   SpO2 97%   BMI 36.35 kg/m                 Constitutional: Pt appears in NAD               HENT: Head: NCAT.  Right Ear: External ear normal.                 Left Ear: External ear normal.                Eyes: . Pupils are equal, round, and reactive to light. Conjunctivae and EOM are normal; Bilat tm's with mild erythema.  Max sinus areas non tender.  Pharynx with mild erythema, no exudate               Nose: without d/c or deformity               Neck: Neck supple. Gross normal ROM               Cardiovascular: Normal rate and regular rhythm.                 Pulmonary/Chest: Effort normal and breath sounds without rales or wheezing.                Abd:  Soft, NT, ND, + BS, no organomegaly               Neurological: Pt is alert. At baseline orientation, motor grossly intact               Skin: Skin is warm. No rashes, no other new lesions, LE edema - none               Psychiatric: Pt behavior is normal without agitation but 2+ nervous  Micro: none  Cardiac tracings I  have personally interpreted today:  none  Pertinent Radiological findings (summarize): none   Lab Results  Component Value Date   WBC 7.0 01/26/2020   HGB 16.0 01/26/2020   HCT 47.3 01/26/2020   PLT 226.0 01/26/2020   GLUCOSE 99 01/26/2020   CHOL 235 (H) 01/26/2020   TRIG 174.0 (H) 01/26/2020   HDL 35.20 (L) 01/26/2020   LDLDIRECT 107.0 05/18/2017   LDLCALC 165 (H) 01/26/2020   ALT 48 01/26/2020   AST 29 01/26/2020   NA 136 01/26/2020   K 4.0 01/26/2020   CL 99 01/26/2020   CREATININE 0.76 01/26/2020   BUN 15 01/26/2020   CO2 30 01/26/2020   TSH 1.78 01/26/2020   HGBA1C 5.5 01/26/2020   Assessment/Plan:  COREYON NICOTRA is a 40 y.o. White or Caucasian [1] male with  has a past medical history of Anal fissure and Hemorrhoids.  Sore throat Most likely due to post nasal gtt, for tx as per allergies,  to f/u any worsening symptoms or concerns  Allergic rhinitis With recent nose bleed likely related to flonase, ok to change to nasacort asd,  to f/u any worsening symptoms or concerns  Anxiety Reassured, declines need for change in tx today,  to f/u any worsening symptoms or concerns  Vitamin D deficiency Last vitamin D Lab Results  Component Value Date   VD25OH 25.84 (L) 01/26/2020   Low, to start otc vit D 2000 u daily replacement   Followup: Return if symptoms worsen or fail to improve.  Cathlean Cower, MD 01/17/2021 2:14 PM [facility]

## 2021-01-17 ENCOUNTER — Encounter: Payer: Self-pay | Admitting: Internal Medicine

## 2021-01-17 DIAGNOSIS — J309 Allergic rhinitis, unspecified: Secondary | ICD-10-CM | POA: Insufficient documentation

## 2021-01-17 DIAGNOSIS — J029 Acute pharyngitis, unspecified: Secondary | ICD-10-CM | POA: Insufficient documentation

## 2021-01-17 NOTE — Assessment & Plan Note (Signed)
Most likely due to post nasal gtt, for tx as per allergies,  to f/u any worsening symptoms or concerns

## 2021-01-17 NOTE — Assessment & Plan Note (Signed)
With recent nose bleed likely related to flonase, ok to change to nasacort asd,  to f/u any worsening symptoms or concerns

## 2021-01-17 NOTE — Assessment & Plan Note (Signed)
Reassured, declines need for change in tx today,  to f/u any worsening symptoms or concerns

## 2021-01-17 NOTE — Patient Instructions (Signed)
Please take all new medication as prescribed 

## 2021-01-17 NOTE — Assessment & Plan Note (Signed)
Last vitamin D Lab Results  Component Value Date   VD25OH 25.84 (L) 01/26/2020   Low, to start otc vit D 2000 u daily replacement

## 2021-04-05 ENCOUNTER — Other Ambulatory Visit: Payer: Self-pay

## 2021-04-05 ENCOUNTER — Other Ambulatory Visit (INDEPENDENT_AMBULATORY_CARE_PROVIDER_SITE_OTHER): Payer: BC Managed Care – PPO

## 2021-04-05 ENCOUNTER — Encounter: Payer: Self-pay | Admitting: Internal Medicine

## 2021-04-05 ENCOUNTER — Ambulatory Visit: Payer: BC Managed Care – PPO | Admitting: Internal Medicine

## 2021-04-05 VITALS — BP 122/78 | HR 73 | Temp 99.1°F | Ht 72.0 in | Wt 272.0 lb

## 2021-04-05 DIAGNOSIS — E785 Hyperlipidemia, unspecified: Secondary | ICD-10-CM | POA: Diagnosis not present

## 2021-04-05 DIAGNOSIS — Z0001 Encounter for general adult medical examination with abnormal findings: Secondary | ICD-10-CM

## 2021-04-05 DIAGNOSIS — R739 Hyperglycemia, unspecified: Secondary | ICD-10-CM | POA: Diagnosis not present

## 2021-04-05 DIAGNOSIS — R490 Dysphonia: Secondary | ICD-10-CM | POA: Insufficient documentation

## 2021-04-05 DIAGNOSIS — E559 Vitamin D deficiency, unspecified: Secondary | ICD-10-CM

## 2021-04-05 DIAGNOSIS — Z125 Encounter for screening for malignant neoplasm of prostate: Secondary | ICD-10-CM

## 2021-04-05 DIAGNOSIS — J029 Acute pharyngitis, unspecified: Secondary | ICD-10-CM

## 2021-04-05 DIAGNOSIS — Z1159 Encounter for screening for other viral diseases: Secondary | ICD-10-CM

## 2021-04-05 LAB — BASIC METABOLIC PANEL
BUN: 14 mg/dL (ref 6–23)
CO2: 28 mEq/L (ref 19–32)
Calcium: 9.7 mg/dL (ref 8.4–10.5)
Chloride: 101 mEq/L (ref 96–112)
Creatinine, Ser: 0.68 mg/dL (ref 0.40–1.50)
GFR: 118.18 mL/min (ref >60.00)
Glucose, Bld: 128 mg/dL — ABNORMAL HIGH (ref 70–99)
Potassium: 4.2 mEq/L (ref 3.5–5.1)
Sodium: 138 mEq/L (ref 135–145)

## 2021-04-05 LAB — URINALYSIS, ROUTINE W REFLEX MICROSCOPIC
Bilirubin Urine: NEGATIVE
Hgb urine dipstick: NEGATIVE
Ketones, ur: NEGATIVE
Leukocytes,Ua: NEGATIVE
Nitrite: NEGATIVE
RBC / HPF: NONE SEEN (ref 0–?)
Specific Gravity, Urine: 1.015 (ref 1.000–1.030)
Urine Glucose: NEGATIVE
Urobilinogen, UA: 0.2 (ref 0.0–1.0)
pH: 7 (ref 5.0–8.0)

## 2021-04-05 LAB — CBC WITH DIFFERENTIAL/PLATELET
Basophils Absolute: 0 10*3/uL (ref 0.0–0.1)
Basophils Relative: 0.7 % (ref 0.0–3.0)
Eosinophils Absolute: 0.2 10*3/uL (ref 0.0–0.7)
Eosinophils Relative: 3.1 % (ref 0.0–5.0)
HCT: 45.6 % (ref 39.0–52.0)
Hemoglobin: 15.8 g/dL (ref 13.0–17.0)
Lymphocytes Relative: 25 % (ref 12.0–46.0)
Lymphs Abs: 1.6 10*3/uL (ref 0.7–4.0)
MCHC: 34.6 g/dL (ref 30.0–36.0)
MCV: 87.4 fl (ref 78.0–100.0)
Monocytes Absolute: 0.4 10*3/uL (ref 0.1–1.0)
Monocytes Relative: 6.1 % (ref 3.0–12.0)
Neutro Abs: 4.2 10*3/uL (ref 1.4–7.7)
Neutrophils Relative %: 65.1 % (ref 43.0–77.0)
Platelets: 210 10*3/uL (ref 150.0–400.0)
RBC: 5.22 Mil/uL (ref 4.22–5.81)
RDW: 13.5 % (ref 11.5–15.5)
WBC: 6.5 10*3/uL (ref 4.0–10.5)

## 2021-04-05 LAB — HEMOGLOBIN A1C: Hgb A1c MFr Bld: 5.9 % (ref 4.6–6.5)

## 2021-04-05 LAB — HEPATIC FUNCTION PANEL
ALT: 78 U/L — ABNORMAL HIGH (ref 0–53)
AST: 44 U/L — ABNORMAL HIGH (ref 0–37)
Albumin: 4.7 g/dL (ref 3.5–5.2)
Alkaline Phosphatase: 97 U/L (ref 39–117)
Bilirubin, Direct: 0.1 mg/dL (ref 0.0–0.3)
Total Bilirubin: 0.8 mg/dL (ref 0.2–1.2)
Total Protein: 7 g/dL (ref 6.0–8.3)

## 2021-04-05 LAB — PSA: PSA: 0.38 ng/mL (ref 0.10–4.00)

## 2021-04-05 LAB — VITAMIN D 25 HYDROXY (VIT D DEFICIENCY, FRACTURES): VITD: 20.23 ng/mL — ABNORMAL LOW (ref 30.00–100.00)

## 2021-04-05 LAB — LIPID PANEL
Cholesterol: 230 mg/dL — ABNORMAL HIGH (ref 0–200)
HDL: 36.6 mg/dL — ABNORMAL LOW (ref >39.00)
NonHDL: 193.65
Total CHOL/HDL Ratio: 6
Triglycerides: 295 mg/dL — ABNORMAL HIGH (ref 0.0–149.0)
VLDL: 59 mg/dL — ABNORMAL HIGH (ref 0.0–40.0)

## 2021-04-05 LAB — TSH: TSH: 2.09 u[IU]/mL (ref 0.35–4.50)

## 2021-04-05 LAB — LDL CHOLESTEROL, DIRECT: Direct LDL: 146 mg/dL

## 2021-04-05 NOTE — Assessment & Plan Note (Signed)
Last vitamin D Lab Results  Component Value Date   VD25OH 25.84 (L) 01/26/2020   Low to start oral replacement

## 2021-04-05 NOTE — Progress Notes (Signed)
Patient ID: Tyler Griffin, male   DOB: [birth year], 40 y.o.   MRN: [id number]         Chief Complaint:: wellness exam and Office Visit (Long-term covid symptom of sore throat)  and hoarseness       HPI:  Tyler Griffin is a 40 y.o. male here for wellness exam; due for hep c screen, o/w up to date with preventive referrals and immunizations                        Also had labs this am.  Does have several wks ongoing nasal allergy symptoms with clearish congestion, itch and sneezing, without fever, pain, cough, swelling or wheezing. But has ongoing post pharygeal and right anklge of jaw pain behind the tongue and intermittent hoarseness and asking for ENT referral.  Denies worsening depressive symptoms, suicidal ideation, or panic; though has some ongoing anxiety.  Has hx of covid vax and booster x 2, and covid infection jan 2022.  Good compliance with PPI.  Daughter had recent flu like illness 3 wks ago.   Pt denies polydipsia, polyuria, or new focal neuro s/s.  No other new complaints   Wt Readings from Last 3 Encounters:  04/05/21 272 lb (123.4 kg)  01/08/21 268 lb (121.6 kg)  01/26/20 271 lb 9.6 oz (123.2 kg)   BP Readings from Last 3 Encounters:  04/05/21 122/78  01/08/21 126/78  01/26/20 122/80   Immunization History  Administered Date(s) Administered  . Influenza,inj,Quad PF,6+ Mos 11/20/2020  . Influenza-Unspecified [birth year]  . PFIZER(Purple Top)SARS-COV-2 Vaccination 04/12/2019, 03/12/2020, 08/13/2020, 11/20/2020  . Tdap 03/08/2015  There are no preventive care reminders to display for this patient.    Past Medical History:  Diagnosis Date  . Anal fissure    underwent fissurotomy  . Hemorrhoids    Past Surgical History:  Procedure Laterality Date  . ANAL FISSURE REPAIR  2009  . ENDOVENOUS ABLATION SAPHENOUS VEIN W/ LASER Left 07/27/2017   endovenous laser ablation L GSV and stab phlebectomy < 10 incisions left leg   . EXCISIONAL HEMORRHOIDECTOMY  2008    reports that  he quit smoking about 6 years ago. His smoking use included cigars. He smoked 0.50 packs per day. He has never used smokeless tobacco. He reports current alcohol use. He reports that he does not use drugs. family history includes Colon cancer in an other family member; Heart disease in his father. No Known Allergies Current Outpatient Medications on File Prior to Visit  Medication Sig Dispense Refill  . omega-3 acid ethyl esters (LOVAZA) 1 g capsule Take 2 capsules (2 g total) by mouth 2 (two) times daily. 180 capsule 3  . pantoprazole (PROTONIX) 40 MG tablet Take 1 tablet (40 mg total) by mouth daily. 90 tablet 3  . ALPRAZolam (XANAX) 0.25 MG tablet Take 1 tablet (0.25 mg total) by mouth 2 (two) times daily as needed for anxiety. (Patient not taking: Reported on 04/05/2021) 30 tablet 1   No current facility-administered medications on file prior to visit.        ROS:  All others reviewed and negative.  Objective        PE:  BP 122/78 (BP Location: Right Arm, Patient Position: Sitting, Cuff Size: Large)   Pulse 73   Temp 99.1 F (37.3 C) (Oral)   Ht 6' (1.829 m)   Wt 272 lb (123.4 kg)   SpO2 96%   BMI 36.89 kg/m  Constitutional: Pt appears in NAD               HENT: Head: NCAT.                Right Ear: External ear normal.                 Left Ear: External ear normal.   Bilat TMs with mild erythema and effusions               Eyes: . Pupils are equal, round, and reactive to light. Conjunctivae and EOM are normal               Nose: without d/c or deformity               Neck: Neck supple. Gross normal ROM               Cardiovascular: Normal rate and regular rhythm.                 Pulmonary/Chest: Effort normal and breath sounds without rales or wheezing.                Abd:  Soft, NT, ND, + BS, no organomegaly               Neurological: Pt is alert. At baseline orientation, motor grossly intact               Skin: Skin is warm. No rashes, no other new lesions,  LE edema - none               Psychiatric: Pt behavior is normal without agitation , 1-2+ nervous  Micro: none  Cardiac tracings I have personally interpreted today:  none  Pertinent Radiological findings (summarize): none   Lab Results  Component Value Date   WBC 6.5 04/05/2021   HGB 15.8 04/05/2021   HCT 45.6 04/05/2021   PLT 210.0 04/05/2021   GLUCOSE 128 (H) 04/05/2021   CHOL 230 (H) 04/05/2021   TRIG 295.0 (H) 04/05/2021   HDL 36.60 (L) 04/05/2021   LDLDIRECT 146.0 04/05/2021   LDLCALC 165 (H) 01/26/2020   ALT 78 (H) 04/05/2021   AST 44 (H) 04/05/2021   NA 138 04/05/2021   K 4.2 04/05/2021   CL 101 04/05/2021   CREATININE 0.68 04/05/2021   BUN 14 04/05/2021   CO2 28 04/05/2021   TSH 2.09 04/05/2021   PSA 0.38 04/05/2021   HGBA1C 5.9 04/05/2021   Assessment/Plan:  Tyler Griffin is a 40 y.o. White or Caucasian [1] male with  has a past medical history of Anal fissure and Hemorrhoids.  Vitamin D deficiency Last vitamin D Lab Results  Component Value Date   VD25OH 25.84 (L) 01/26/2020   Low to start oral replacement   Encounter for well adult exam with abnormal findings Age and sex appropriate education and counseling updated with regular exercise and diet Referrals for preventative services - for hep c screen Immunizations addressed - none needed Smoking counseling  - none needed Evidence for depression or other mood disorder - nervous today Most recent labs reviewed. I have personally reviewed and have noted: 1) the patient's medical and social history 2) The patient's current medications and supplements 3) The patient's height, weight, and BMI have been recorded in the chart   Hyperglycemia Lab Results  Component Value Date   HGBA1C 5.9 04/05/2021   Stable, pt to continue current medical treatment  - diet   Sore  throat ? Allergy related, ok for otc allegra and nasacort   Hoarseness Also for ENT referral, cont PPI  Dyslipidemia Lab Results   Component Value Date   LDLCALC 165 (H) 01/26/2020   Stable, pt to continue current low chol diet and lovaza, declines statin for now   Followup: Return in about 1 year (around 04/05/2022).  Cathlean Cower, MD 04/07/2021 2:01 PM [facility]

## 2021-04-05 NOTE — Addendum Note (Signed)
Addended by: Boris Lown B on: 04/05/2021 08:34 AM   Modules accepted: Orders

## 2021-04-05 NOTE — Patient Instructions (Signed)
Please continue all other medications as before, and refills have been done if requested.  Please have the pharmacy call with any other refills you may need.  Please continue your efforts at being more active, low cholesterol diet, and weight control.  You are otherwise up to date with prevention measures today.  Please keep your appointments with your specialists as you may have planned  You will be contacted regarding the referral for: Dr Melene Plan - ENT  Your lab work was done today  You will be contacted by phone if any changes need to be made immediately.  Otherwise, you will receive a letter about your results with an explanation, but please check with MyChart first.  Please remember to sign up for MyChart if you have not done so, as this will be important to you in the future with finding out test results, communicating by private email, and scheduling acute appointments online when needed.  Please make an Appointment to return for your 1 year visit, or sooner if needed, with Lab testing by Appointment as well, to be done about 3-5 days before at the [facility] (so this is for TWO appointments - please see the scheduling desk as you leave)  Due to the ongoing Covid 19 pandemic, our lab now requires an appointment for any labs done at our office.  If you need labs done and do not have an appointment, please call our office ahead of time to schedule before presenting to the lab for your testing.

## 2021-04-07 ENCOUNTER — Encounter: Payer: Self-pay | Admitting: Internal Medicine

## 2021-04-07 NOTE — Assessment & Plan Note (Signed)
?   Allergy related, ok for otc allegra and nasacort

## 2021-04-07 NOTE — Assessment & Plan Note (Signed)
Lab Results  Component Value Date   LDLCALC 165 (H) 01/26/2020   Stable, pt to continue current low chol diet and lovaza, declines statin for now

## 2021-04-07 NOTE — Addendum Note (Signed)
Addended by: Biagio Borg on: 04/07/2021 02:10 PM   Modules accepted: Orders

## 2021-04-07 NOTE — Assessment & Plan Note (Signed)
Also for ENT referral, cont PPI

## 2021-04-07 NOTE — Assessment & Plan Note (Signed)
Lab Results  Component Value Date   HGBA1C 5.9 04/05/2021   Stable, pt to continue current medical treatment  - diet

## 2021-04-07 NOTE — Assessment & Plan Note (Signed)
Age and sex appropriate education and counseling updated with regular exercise and diet Referrals for preventative services - for hep c screen Immunizations addressed - none needed Smoking counseling  - none needed Evidence for depression or other mood disorder - nervous today Most recent labs reviewed. I have personally reviewed and have noted: 1) the patient's medical and social history 2) The patient's current medications and supplements 3) The patient's height, weight, and BMI have been recorded in the chart

## 2021-04-09 LAB — HEPATITIS C ANTIBODY
Hepatitis C Ab: NONREACTIVE
SIGNAL TO CUT-OFF: 0 (ref <1.00)

## 2021-05-03 DIAGNOSIS — R070 Pain in throat: Secondary | ICD-10-CM | POA: Diagnosis not present

## 2021-05-03 DIAGNOSIS — K219 Gastro-esophageal reflux disease without esophagitis: Secondary | ICD-10-CM | POA: Diagnosis not present

## 2021-06-18 DIAGNOSIS — H40053 Ocular hypertension, bilateral: Secondary | ICD-10-CM | POA: Diagnosis not present

## 2021-07-07 ENCOUNTER — Other Ambulatory Visit: Payer: Self-pay | Admitting: Internal Medicine

## 2021-07-07 DIAGNOSIS — E781 Pure hyperglyceridemia: Secondary | ICD-10-CM

## 2021-07-07 NOTE — Telephone Encounter (Signed)
Please refill as per office routine med refill policy (all routine meds to be refilled for 3 mo or monthly (per pt preference) up to one year from last visit, then month to month grace period for 3 mo, then further med refills will have to be denied) ? ?

## 2022-01-02 ENCOUNTER — Other Ambulatory Visit: Payer: Self-pay | Admitting: Internal Medicine

## 2022-01-02 DIAGNOSIS — E781 Pure hyperglyceridemia: Secondary | ICD-10-CM

## 2022-01-02 NOTE — Telephone Encounter (Signed)
Please refill as per office routine med refill policy (all routine meds to be refilled for 3 mo or monthly (per pt preference) up to one year from last visit, then month to month grace period for 3 mo, then further med refills will have to be denied) ? ?

## 2022-04-04 ENCOUNTER — Encounter: Payer: Self-pay | Admitting: Internal Medicine

## 2022-04-04 ENCOUNTER — Ambulatory Visit (INDEPENDENT_AMBULATORY_CARE_PROVIDER_SITE_OTHER): Payer: BC Managed Care – PPO | Admitting: Internal Medicine

## 2022-04-04 ENCOUNTER — Other Ambulatory Visit: Payer: Self-pay | Admitting: Internal Medicine

## 2022-04-04 ENCOUNTER — Telehealth: Payer: Self-pay | Admitting: Internal Medicine

## 2022-04-04 VITALS — BP 124/80 | HR 84 | Temp 98.5°F | Ht 72.0 in | Wt 268.0 lb

## 2022-04-04 DIAGNOSIS — R739 Hyperglycemia, unspecified: Secondary | ICD-10-CM | POA: Diagnosis not present

## 2022-04-04 DIAGNOSIS — E669 Obesity, unspecified: Secondary | ICD-10-CM | POA: Diagnosis not present

## 2022-04-04 DIAGNOSIS — Z0001 Encounter for general adult medical examination with abnormal findings: Secondary | ICD-10-CM

## 2022-04-04 DIAGNOSIS — Z125 Encounter for screening for malignant neoplasm of prostate: Secondary | ICD-10-CM

## 2022-04-04 DIAGNOSIS — F419 Anxiety disorder, unspecified: Secondary | ICD-10-CM

## 2022-04-04 DIAGNOSIS — E785 Hyperlipidemia, unspecified: Secondary | ICD-10-CM | POA: Diagnosis not present

## 2022-04-04 DIAGNOSIS — E538 Deficiency of other specified B group vitamins: Secondary | ICD-10-CM

## 2022-04-04 DIAGNOSIS — E781 Pure hyperglyceridemia: Secondary | ICD-10-CM

## 2022-04-04 DIAGNOSIS — E559 Vitamin D deficiency, unspecified: Secondary | ICD-10-CM

## 2022-04-04 LAB — URINALYSIS, ROUTINE W REFLEX MICROSCOPIC
Bilirubin Urine: NEGATIVE
Hgb urine dipstick: NEGATIVE
Ketones, ur: NEGATIVE
Leukocytes,Ua: NEGATIVE
Nitrite: NEGATIVE
RBC / HPF: NONE SEEN (ref 0–?)
Specific Gravity, Urine: <=1.005 — AB (ref 1.000–1.030)
Total Protein, Urine: NEGATIVE
Urine Glucose: NEGATIVE
Urobilinogen, UA: 0.2 (ref 0.0–1.0)
pH: 5.5 (ref 5.0–8.0)

## 2022-04-04 LAB — HEPATIC FUNCTION PANEL
ALT: 86 U/L — ABNORMAL HIGH (ref 0–53)
AST: 57 U/L — ABNORMAL HIGH (ref 0–37)
Albumin: 5.1 g/dL (ref 3.5–5.2)
Alkaline Phosphatase: 96 U/L (ref 39–117)
Bilirubin, Direct: 0.2 mg/dL (ref 0.0–0.3)
Total Bilirubin: 1.1 mg/dL (ref 0.2–1.2)
Total Protein: 7.6 g/dL (ref 6.0–8.3)

## 2022-04-04 LAB — VITAMIN B12: Vitamin B-12: 453 pg/mL (ref 211–911)

## 2022-04-04 LAB — CBC WITH DIFFERENTIAL/PLATELET
Basophils Absolute: 0 10*3/uL (ref 0.0–0.1)
Basophils Relative: 0.6 % (ref 0.0–3.0)
Eosinophils Absolute: 0.1 10*3/uL (ref 0.0–0.7)
Eosinophils Relative: 1.7 % (ref 0.0–5.0)
HCT: 46.1 % (ref 39.0–52.0)
Hemoglobin: 15.8 g/dL (ref 13.0–17.0)
Lymphocytes Relative: 23.4 % (ref 12.0–46.0)
Lymphs Abs: 1.5 10*3/uL (ref 0.7–4.0)
MCHC: 34.2 g/dL (ref 30.0–36.0)
MCV: 87.8 fl (ref 78.0–100.0)
Monocytes Absolute: 0.4 10*3/uL (ref 0.1–1.0)
Monocytes Relative: 6.7 % (ref 3.0–12.0)
Neutro Abs: 4.2 10*3/uL (ref 1.4–7.7)
Neutrophils Relative %: 67.6 % (ref 43.0–77.0)
Platelets: 230 10*3/uL (ref 150.0–400.0)
RBC: 5.25 Mil/uL (ref 4.22–5.81)
RDW: 12.7 % (ref 11.5–15.5)
WBC: 6.3 10*3/uL (ref 4.0–10.5)

## 2022-04-04 LAB — LIPID PANEL
Cholesterol: 260 mg/dL — ABNORMAL HIGH (ref 0–200)
HDL: 38.8 mg/dL — ABNORMAL LOW (ref >39.00)
NonHDL: 221.02
Total CHOL/HDL Ratio: 7
Triglycerides: 234 mg/dL — ABNORMAL HIGH (ref 0.0–149.0)
VLDL: 46.8 mg/dL — ABNORMAL HIGH (ref 0.0–40.0)

## 2022-04-04 LAB — BASIC METABOLIC PANEL
BUN: 11 mg/dL (ref 6–23)
CO2: 29 mEq/L (ref 19–32)
Calcium: 10.1 mg/dL (ref 8.4–10.5)
Chloride: 100 mEq/L (ref 96–112)
Creatinine, Ser: 0.79 mg/dL (ref 0.40–1.50)
GFR: 112.16 mL/min (ref >60.00)
Glucose, Bld: 131 mg/dL — ABNORMAL HIGH (ref 70–99)
Potassium: 4.5 mEq/L (ref 3.5–5.1)
Sodium: 139 mEq/L (ref 135–145)

## 2022-04-04 LAB — PSA: PSA: 0.59 ng/mL (ref 0.10–4.00)

## 2022-04-04 LAB — TSH: TSH: 2.28 u[IU]/mL (ref 0.35–5.50)

## 2022-04-04 LAB — LDL CHOLESTEROL, DIRECT: Direct LDL: 189 mg/dL

## 2022-04-04 LAB — VITAMIN D 25 HYDROXY (VIT D DEFICIENCY, FRACTURES): VITD: 30.82 ng/mL (ref 30.00–100.00)

## 2022-04-04 LAB — HEMOGLOBIN A1C: Hgb A1c MFr Bld: 5.8 % (ref 4.6–6.5)

## 2022-04-04 MED ORDER — WEGOVY 0.5 MG/0.5ML ~~LOC~~ SOAJ: 0.5000 mg | SUBCUTANEOUS | 11 refills | Status: DC

## 2022-04-04 MED ORDER — PANTOPRAZOLE SODIUM 40 MG PO TBEC: 40.0000 mg | DELAYED_RELEASE_TABLET | Freq: Every day | ORAL | 3 refills | Status: DC

## 2022-04-04 MED ORDER — OMEGA-3-ACID ETHYL ESTERS 1 G PO CAPS: 2.0000 | ORAL_CAPSULE | Freq: Two times a day (BID) | ORAL | 3 refills | Status: DC

## 2022-04-04 MED ORDER — ALPRAZOLAM 0.25 MG PO TABS: 0.2500 mg | ORAL_TABLET | Freq: Two times a day (BID) | ORAL | 2 refills | Status: DC | PRN

## 2022-04-04 NOTE — Progress Notes (Unsigned)
Patient ID: Tyler Griffin, male   DOB: 01/20/1981, 41 y.o.   MRN: [id number]         Chief Complaint:: wellness exam and obesity, low vit d, hld, hyperglycemia       HPI:  Tyler Griffin is a 41 y.o. male here for wellness exam; declines covid booster, o/w up to date                        Also s/p left varicosity surgury but seems to have returned.  Has marked difficulty with wt loss despite activity and trying for less calories.  Taking low dose Vit D.  Trying to follow lower chol diet.  Pt denies chest pain, increased sob or doe, wheezing, orthopnea, PND, increased LE swelling, palpitations, dizziness or syncope.   Pt denies polydipsia, polyuria, or new focal neuro s/s.    Pt denies fever, wt loss, night sweats, loss of appetite, or other constitutional symptoms  Denies worsening depressive symptoms, suicidal ideation, or panic; has ongoing anxiety Wt Readings from Last 3 Encounters:  04/04/22 268 lb (121.6 kg)  04/05/21 272 lb (123.4 kg)  01/08/21 268 lb (121.6 kg)   BP Readings from Last 3 Encounters:  04/04/22 124/80  04/05/21 122/78  01/08/21 126/78   Immunization History  Administered Date(s) Administered   Influenza,inj,Quad PF,6+ Mos 11/20/2020   Influenza-Unspecified Aug 29, 1981   PFIZER(Purple Top)SARS-COV-2 Vaccination 04/12/2019, 03/12/2020, 08/13/2020, 11/20/2020   Tdap 03/08/2015  There are no preventive care reminders to display for this patient.    Past Medical History:  Diagnosis Date   Anal fissure    underwent fissurotomy   Hemorrhoids    Past Surgical History:  Procedure Laterality Date   ANAL FISSURE REPAIR  2009   ENDOVENOUS ABLATION SAPHENOUS VEIN W/ LASER Left 07/27/2017   endovenous laser ablation L GSV and stab phlebectomy < 10 incisions left leg    EXCISIONAL HEMORRHOIDECTOMY  2008    reports that he quit smoking about 7 years ago. His smoking use included cigars and cigarettes. He smoked an average of .5 packs per day. He has never used smokeless  tobacco. He reports current alcohol use. He reports that he does not use drugs. family history includes Colon cancer in an other family member; Heart disease in his father. No Known Allergies No current outpatient medications on file prior to visit.   No current facility-administered medications on file prior to visit.        ROS:  All others reviewed and negative.  Objective        PE:  BP 124/80 (BP Location: Left Arm, Patient Position: Sitting, Cuff Size: Large)   Pulse 84   Temp 98.5 F (36.9 C) (Oral)   Ht 6' (1.829 m)   Wt 268 lb (121.6 kg)   SpO2 97%   BMI 36.35 kg/m                 Constitutional: Pt appears in NAD               HENT: Head: NCAT.                Right Ear: External ear normal.                 Left Ear: External ear normal.                Eyes: . Pupils are equal, round, and reactive to light. Conjunctivae and EOM are  normal               Nose: without d/c or deformity               Neck: Neck supple. Gross normal ROM               Cardiovascular: Normal rate and regular rhythm.                 Pulmonary/Chest: Effort normal and breath sounds without rales or wheezing.                Abd:  Soft, NT, ND, + BS, no organomegaly               Neurological: Pt is alert. At baseline orientation, motor grossly intact               Skin: Skin is warm. No rashes, no other new lesions, LE edema - none               Psychiatric: Pt behavior is normal without agitation , mild nervous  Micro: none  Cardiac tracings I have personally interpreted today:  none  Pertinent Radiological findings (summarize): none   Lab Results  Component Value Date   WBC 6.3 04/04/2022   HGB 15.8 04/04/2022   HCT 46.1 04/04/2022   PLT 230.0 04/04/2022   GLUCOSE 131 (H) 04/04/2022   CHOL 260 (H) 04/04/2022   TRIG 234.0 (H) 04/04/2022   HDL 38.80 (L) 04/04/2022   LDLDIRECT 189.0 04/04/2022   LDLCALC 165 (H) 01/26/2020   ALT 86 (H) 04/04/2022   AST 57 (H) 04/04/2022   NA 139  04/04/2022   K 4.5 04/04/2022   CL 100 04/04/2022   CREATININE 0.79 04/04/2022   BUN 11 04/04/2022   CO2 29 04/04/2022   TSH 2.28 04/04/2022   PSA 0.59 04/04/2022   HGBA1C 5.8 04/04/2022   Assessment/Plan:  Tyler Griffin is a 41 y.o. White or Caucasian [1] male with  has a past medical history of Anal fissure and Hemorrhoids.  Vitamin D deficiency Last vitamin D Lab Results  Component Value Date   VD25OH 20.23 (L) 04/05/2021   Low, to increase oral replacement to 5000 u qd  Encounter for well adult exam with abnormal findings Age and sex appropriate education and counseling updated with regular exercise and diet Referrals for preventative services - none needed Immunizations addressed - declines covid booster Smoking counseling  - none needed Evidence for depression or other mood disorder - none significant Most recent labs reviewed. 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  Cathlean Cower, MD 04/06/2022 9:06 PM [facility]

## 2022-04-04 NOTE — Assessment & Plan Note (Signed)
Last vitamin D Lab Results  Component Value Date   VD25OH 20.23 (L) 04/05/2021   Low, to start oral replacement

## 2022-04-04 NOTE — Patient Instructions (Signed)
Please take all new medication as prescribed - the wegovy  Please continue all other medications as before, including the Vitamin D  Please have the pharmacy call with any other refills you may need.  Please continue your efforts at being more active, low cholesterol diet, and weight control.  You are otherwise up to date with prevention measures today.  Please keep your appointments with your specialists as you may have planned  Please go to the LAB at the blood drawing area for the tests to be done  You will be contacted by phone if any changes need to be made immediately.  Otherwise, you will receive a letter about your results with an explanation, but please check with MyChart first.  Please remember to sign up for MyChart if you have not done so, as this will be important to you in the future with finding out test results, communicating by private email, and scheduling acute appointments online when needed.  Please make an Appointment to return for your 1 year visit, or sooner if needed

## 2022-04-06 ENCOUNTER — Encounter: Payer: Self-pay | Admitting: Internal Medicine

## 2022-04-06 NOTE — Assessment & Plan Note (Signed)

## 2022-04-06 NOTE — Assessment & Plan Note (Signed)
Stable, cont current xanax prn,  to f/u any worsening symptoms or concerns

## 2022-04-06 NOTE — Assessment & Plan Note (Signed)
Chronic persistent, for wegovy asd if ok with insurance 

## 2022-04-06 NOTE — Assessment & Plan Note (Signed)
Lab Results  Component Value Date   HGBA1C 5.8 04/04/2022   Stable, pt to continue current medical treatment  - diet

## 2022-04-06 NOTE — Assessment & Plan Note (Signed)
Lab Results  Component Value Date   LDLCALC 165 (H) 01/26/2020   severe uncontrolled, pt to continue current low chol diet, declines statin

## 2022-04-10 ENCOUNTER — Telehealth: Payer: Self-pay | Admitting: Internal Medicine

## 2022-04-10 NOTE — Telephone Encounter (Signed)
Patient is traveling to [place] - 06/15/2022 to 07/07/22 - he would like a prescription anti malarial drug - please send this into [facility] - [place], [place]

## 2022-04-11 MED ORDER — MEFLOQUINE HCL 250 MG PO TABS: 250.0000 mg | ORAL_TABLET | ORAL | 0 refills | Status: DC

## 2022-04-11 NOTE — Telephone Encounter (Signed)
Ok done

## 2022-04-11 NOTE — Telephone Encounter (Signed)
In order to do the rx correctly, we have to know how many weeks he will be there

## 2022-04-11 NOTE — Telephone Encounter (Signed)
Per patient, he will be in [place] for 2 weeks

## 2022-04-14 NOTE — Telephone Encounter (Signed)
Patient notified

## 2022-05-01 DIAGNOSIS — H9012 Conductive hearing loss, unilateral, left ear, with unrestricted hearing on the contralateral side: Secondary | ICD-10-CM | POA: Diagnosis not present

## 2022-05-01 DIAGNOSIS — H6122 Impacted cerumen, left ear: Secondary | ICD-10-CM | POA: Diagnosis not present

## 2022-05-02 ENCOUNTER — Telehealth: Payer: Self-pay | Admitting: Internal Medicine

## 2022-05-02 MED ORDER — FENOFIBRATE 145 MG PO TABS: 145.0000 mg | ORAL_TABLET | Freq: Every day | ORAL | 3 refills | Status: DC

## 2022-05-02 NOTE — Telephone Encounter (Signed)
Pt was advised he needs a PA for [facility].    Please advise.

## 2022-05-05 NOTE — Telephone Encounter (Signed)
Patient notified via voicemail.

## 2022-05-06 ENCOUNTER — Telehealth: Payer: Self-pay | Admitting: Internal Medicine

## 2022-05-06 ENCOUNTER — Telehealth: Payer: Self-pay | Admitting: *Deleted

## 2022-05-06 NOTE — Telephone Encounter (Signed)
Pt was on cover-my-meds need PA for [facility]. Submitted w/ [employer] [id number] med came back DENIED. Pt must try formulary phentermine tab/cap may need PA w/ phentermine. Fax determination to pof.Marland KitchenRaechel Chute

## 2022-05-06 NOTE — Telephone Encounter (Signed)
Rec'd determination back med was DENIED. It states This request has received a Unfavorable outcome. No alternative was given...Raechel Chute

## 2022-05-14 NOTE — Telephone Encounter (Signed)
Look back on PA the reason med was denied it states"  Pt must try formulary phentermine tab/cap may need PA w/ phentermine".Marland KitchenJohny Chess

## 2022-05-14 NOTE — Telephone Encounter (Signed)
Ok to try phentermine if he wants for 3 mo to see if can tolerate med and gets wt loss.   3 mo is usual limit however as this medication is never meant to be taken long term;

## 2022-05-15 ENCOUNTER — Telehealth: Payer: Self-pay | Admitting: *Deleted

## 2022-05-15 MED ORDER — PHENTERMINE HCL 37.5 MG PO CAPS: 37.5000 mg | ORAL_CAPSULE | ORAL | 2 refills | Status: DC

## 2022-05-15 NOTE — Telephone Encounter (Signed)
Ok done to local pharmacy 

## 2022-05-15 NOTE — Telephone Encounter (Signed)
Patient states that he is willing to try Phentermine

## 2022-05-15 NOTE — Telephone Encounter (Signed)
Pt was on cover-my-meds need PA on Phentermine 37.5 mg. Submitted PA w/  (Key: [id number]) Rec'd msg " Your information has been submitted to [employer].".Marland KitchenJohny Chess

## 2022-05-19 NOTE — Telephone Encounter (Signed)
Rec;d determination fax meed was APPROVED, It states "This request has received a Favorable outcome. Effective from 05/19/2022 through 08/19/2022" Faxing approval to [employer].Marland KitchenJohny Griffin

## 2022-07-03 DIAGNOSIS — J00 Acute nasopharyngitis [common cold]: Secondary | ICD-10-CM | POA: Diagnosis not present

## 2022-07-03 DIAGNOSIS — Z6835 Body mass index (BMI) 35.0-35.9, adult: Secondary | ICD-10-CM | POA: Diagnosis not present

## 2022-12-24 DIAGNOSIS — J384 Edema of larynx: Secondary | ICD-10-CM | POA: Diagnosis not present

## 2022-12-24 DIAGNOSIS — J383 Other diseases of vocal cords: Secondary | ICD-10-CM | POA: Diagnosis not present

## 2022-12-24 DIAGNOSIS — K219 Gastro-esophageal reflux disease without esophagitis: Secondary | ICD-10-CM | POA: Insufficient documentation

## 2023-02-23 DIAGNOSIS — R070 Pain in throat: Secondary | ICD-10-CM | POA: Diagnosis not present

## 2023-02-23 DIAGNOSIS — J382 Nodules of vocal cords: Secondary | ICD-10-CM | POA: Diagnosis not present

## 2023-02-24 DIAGNOSIS — J383 Other diseases of vocal cords: Secondary | ICD-10-CM | POA: Diagnosis not present

## 2023-02-24 DIAGNOSIS — D164 Benign neoplasm of bones of skull and face: Secondary | ICD-10-CM | POA: Diagnosis not present

## 2023-03-04 DIAGNOSIS — D164 Benign neoplasm of bones of skull and face: Secondary | ICD-10-CM | POA: Diagnosis not present

## 2023-03-06 DIAGNOSIS — J383 Other diseases of vocal cords: Secondary | ICD-10-CM

## 2023-03-06 HISTORY — DX: Other diseases of vocal cords: J38.3

## 2023-03-09 ENCOUNTER — Other Ambulatory Visit: Payer: Self-pay | Admitting: Otolaryngology

## 2023-03-18 ENCOUNTER — Encounter (HOSPITAL_BASED_OUTPATIENT_CLINIC_OR_DEPARTMENT_OTHER): Payer: Self-pay | Admitting: Otolaryngology

## 2023-03-18 ENCOUNTER — Other Ambulatory Visit: Payer: Self-pay

## 2023-03-19 DIAGNOSIS — H40053 Ocular hypertension, bilateral: Secondary | ICD-10-CM | POA: Diagnosis not present

## 2023-03-25 ENCOUNTER — Ambulatory Visit (HOSPITAL_BASED_OUTPATIENT_CLINIC_OR_DEPARTMENT_OTHER): Payer: BC Managed Care – PPO | Admitting: Anesthesiology

## 2023-03-25 ENCOUNTER — Ambulatory Visit (HOSPITAL_BASED_OUTPATIENT_CLINIC_OR_DEPARTMENT_OTHER)
Admission: RE | Admit: 2023-03-25 | Discharge: 2023-03-25 | Disposition: A | Discharge status: Home / Self Care | Payer: BC Managed Care – PPO | Attending: Otolaryngology | Admitting: Otolaryngology

## 2023-03-25 ENCOUNTER — Other Ambulatory Visit: Payer: Self-pay

## 2023-03-25 ENCOUNTER — Encounter (HOSPITAL_BASED_OUTPATIENT_CLINIC_OR_DEPARTMENT_OTHER)
Admission: RE | Disposition: A | Discharge status: Home / Self Care | Payer: Self-pay | Source: Home / Self Care | Attending: Otolaryngology

## 2023-03-25 ENCOUNTER — Encounter (HOSPITAL_BASED_OUTPATIENT_CLINIC_OR_DEPARTMENT_OTHER): Payer: Self-pay | Admitting: Otolaryngology

## 2023-03-25 DIAGNOSIS — Z79899 Other long term (current) drug therapy: Secondary | ICD-10-CM | POA: Diagnosis not present

## 2023-03-25 DIAGNOSIS — K219 Gastro-esophageal reflux disease without esophagitis: Secondary | ICD-10-CM | POA: Diagnosis not present

## 2023-03-25 DIAGNOSIS — F419 Anxiety disorder, unspecified: Secondary | ICD-10-CM | POA: Insufficient documentation

## 2023-03-25 DIAGNOSIS — D164 Benign neoplasm of bones of skull and face: Secondary | ICD-10-CM | POA: Insufficient documentation

## 2023-03-25 DIAGNOSIS — Z87891 Personal history of nicotine dependence: Secondary | ICD-10-CM | POA: Diagnosis not present

## 2023-03-25 HISTORY — DX: Gastro-esophageal reflux disease without esophagitis: K21.9

## 2023-03-25 HISTORY — PX: BONE EXOSTOSIS EXCISION: SHX1249

## 2023-03-25 SURGERY — EXCISION, EXOSTOSIS
Anesthesia: General | Site: Head | Laterality: Right

## 2023-03-25 MED ORDER — FENTANYL CITRATE (PF) 100 MCG/2ML IJ SOLN
INTRAMUSCULAR | Status: AC
  Filled 2023-03-25: qty 2

## 2023-03-25 MED ORDER — LACTATED RINGERS IV SOLN: INTRAVENOUS | Status: DC

## 2023-03-25 MED ORDER — ONDANSETRON HCL 4 MG/2ML IJ SOLN
INTRAMUSCULAR | Status: DC | PRN
  Administered 2023-03-25: 4 mg via INTRAVENOUS

## 2023-03-25 MED ORDER — ROCURONIUM BROMIDE 100 MG/10ML IV SOLN
INTRAVENOUS | Status: DC | PRN
  Administered 2023-03-25: 60 mg via INTRAVENOUS

## 2023-03-25 MED ORDER — PROPOFOL 10 MG/ML IV BOLUS
INTRAVENOUS | Status: AC
  Filled 2023-03-25: qty 20

## 2023-03-25 MED ORDER — MIDAZOLAM HCL 2 MG/2ML IJ SOLN
INTRAMUSCULAR | Status: AC
  Filled 2023-03-25: qty 2

## 2023-03-25 MED ORDER — BACITRACIN ZINC 500 UNIT/GM EX OINT
TOPICAL_OINTMENT | CUTANEOUS | Status: AC
  Filled 2023-03-25: qty 0.9

## 2023-03-25 MED ORDER — BACITRACIN ZINC 500 UNIT/GM EX OINT
TOPICAL_OINTMENT | CUTANEOUS | Status: AC
  Filled 2023-03-25: qty 28.35

## 2023-03-25 MED ORDER — LIDOCAINE-EPINEPHRINE 1 %-1:100000 IJ SOLN
INTRAMUSCULAR | Status: DC | PRN
  Administered 2023-03-25: 2 mL

## 2023-03-25 MED ORDER — PROPOFOL 10 MG/ML IV BOLUS
INTRAVENOUS | Status: DC | PRN
  Administered 2023-03-25: 200 mg via INTRAVENOUS

## 2023-03-25 MED ORDER — BUPIVACAINE HCL (PF) 0.25 % IJ SOLN
INTRAMUSCULAR | Status: AC
  Filled 2023-03-25: qty 30

## 2023-03-25 MED ORDER — ONDANSETRON HCL 4 MG/2ML IJ SOLN
INTRAMUSCULAR | Status: AC
  Filled 2023-03-25: qty 2

## 2023-03-25 MED ORDER — ACETAMINOPHEN 500 MG PO TABS
ORAL_TABLET | ORAL | Status: AC
  Filled 2023-03-25: qty 2

## 2023-03-25 MED ORDER — ACETAMINOPHEN 500 MG PO TABS
1000.0000 mg | ORAL_TABLET | Freq: Once | ORAL | Status: AC
  Administered 2023-03-25: 1000 mg via ORAL

## 2023-03-25 MED ORDER — DEXAMETHASONE SODIUM PHOSPHATE 4 MG/ML IJ SOLN
INTRAMUSCULAR | Status: DC | PRN
  Administered 2023-03-25: 10 mg via INTRAVENOUS

## 2023-03-25 MED ORDER — OXYCODONE HCL 5 MG/5ML PO SOLN: 5.0000 mg | Freq: Once | ORAL | Status: DC | PRN

## 2023-03-25 MED ORDER — DEXAMETHASONE SODIUM PHOSPHATE 10 MG/ML IJ SOLN
INTRAMUSCULAR | Status: AC
  Filled 2023-03-25: qty 1

## 2023-03-25 MED ORDER — CEFAZOLIN SODIUM-DEXTROSE 2-4 GM/100ML-% IV SOLN
2.0000 g | INTRAVENOUS | Status: AC
  Administered 2023-03-25: 2 g via INTRAVENOUS
  Administered 2023-03-25: 1 g via INTRAVENOUS

## 2023-03-25 MED ORDER — ROCURONIUM BROMIDE 10 MG/ML (PF) SYRINGE
PREFILLED_SYRINGE | INTRAVENOUS | Status: AC
  Filled 2023-03-25: qty 10

## 2023-03-25 MED ORDER — AMISULPRIDE (ANTIEMETIC) 5 MG/2ML IV SOLN: 10.0000 mg | Freq: Once | INTRAVENOUS | Status: DC | PRN

## 2023-03-25 MED ORDER — FENTANYL CITRATE (PF) 100 MCG/2ML IJ SOLN
25.0000 ug | INTRAMUSCULAR | Status: DC | PRN
  Administered 2023-03-25: 50 ug via INTRAVENOUS

## 2023-03-25 MED ORDER — CEFAZOLIN SODIUM-DEXTROSE 1-4 GM/50ML-% IV SOLN
INTRAVENOUS | Status: AC
  Filled 2023-03-25: qty 50

## 2023-03-25 MED ORDER — BACITRACIN 500 UNIT/GM EX OINT
TOPICAL_OINTMENT | CUTANEOUS | Status: DC | PRN
  Administered 2023-03-25: 1 via TOPICAL

## 2023-03-25 MED ORDER — FENTANYL CITRATE (PF) 100 MCG/2ML IJ SOLN
INTRAMUSCULAR | Status: DC | PRN
  Administered 2023-03-25: 100 ug via INTRAVENOUS

## 2023-03-25 MED ORDER — LIDOCAINE-EPINEPHRINE 1 %-1:100000 IJ SOLN
INTRAMUSCULAR | Status: AC
  Filled 2023-03-25: qty 1

## 2023-03-25 MED ORDER — LIDOCAINE HCL (CARDIAC) PF 100 MG/5ML IV SOSY
PREFILLED_SYRINGE | INTRAVENOUS | Status: DC | PRN
  Administered 2023-03-25: 100 mg via INTRAVENOUS

## 2023-03-25 MED ORDER — LIDOCAINE 2% (20 MG/ML) 5 ML SYRINGE
INTRAMUSCULAR | Status: AC
  Filled 2023-03-25: qty 5

## 2023-03-25 MED ORDER — MIDAZOLAM HCL 5 MG/5ML IJ SOLN
INTRAMUSCULAR | Status: DC | PRN
  Administered 2023-03-25: 2 mg via INTRAVENOUS

## 2023-03-25 MED ORDER — HYDROCODONE-ACETAMINOPHEN 5-325 MG PO TABS: 1.0000 | ORAL_TABLET | Freq: Four times a day (QID) | ORAL | 0 refills | Status: AC | PRN

## 2023-03-25 MED ORDER — CEFAZOLIN SODIUM-DEXTROSE 2-4 GM/100ML-% IV SOLN
INTRAVENOUS | Status: AC
  Filled 2023-03-25: qty 100

## 2023-03-25 MED ORDER — SUGAMMADEX SODIUM 200 MG/2ML IV SOLN
INTRAVENOUS | Status: DC | PRN
  Administered 2023-03-25: 200 mg via INTRAVENOUS

## 2023-03-25 MED ORDER — OXYCODONE HCL 5 MG PO TABS: 5.0000 mg | ORAL_TABLET | Freq: Once | ORAL | Status: DC | PRN

## 2023-03-25 SURGICAL SUPPLY — 58 items
ADH SKN CLS APL DERMABOND .7 (GAUZE/BANDAGES/DRESSINGS) ×1
APL SRG 3 HI ABS STRL LF PLS (MISCELLANEOUS)
APPLICATOR DR MATTHEWS STRL (MISCELLANEOUS) IMPLANT
BLADE CLIPPER SURG (BLADE) IMPLANT
BLADE SURG 15 STRL LF DISP TIS (BLADE) ×1 IMPLANT
BLADE SURG 15 STRL SS (BLADE) ×1
BNDG GZE 12X3 1 PLY HI ABS (GAUZE/BANDAGES/DRESSINGS)
BNDG STRETCH GAUZE 3IN X12FT (GAUZE/BANDAGES/DRESSINGS) IMPLANT
BUR EGG 3PK/BX (BURR) IMPLANT
BUR PEAR (BURR) IMPLANT
BUR SURG RND 4.0 COARSE DIAM (BURR) IMPLANT
CANISTER SUCT 1200ML W/VALVE (MISCELLANEOUS) ×1 IMPLANT
CORD BIPOLAR FORCEPS 12FT (ELECTRODE) IMPLANT
COVER BACK TABLE 60X90IN (DRAPES) ×1 IMPLANT
COVER MAYO STAND STRL (DRAPES) ×1 IMPLANT
DERMABOND ADVANCED .7 DNX12 (GAUZE/BANDAGES/DRESSINGS) ×1 IMPLANT
DRAPE U-SHAPE 76X120 STRL (DRAPES) ×1 IMPLANT
DRSG TEGADERM 2-3/8X2-3/4 SM (GAUZE/BANDAGES/DRESSINGS) IMPLANT
ELECT COATED BLADE 2.86 ST (ELECTRODE) IMPLANT
ELECT NDL BLADE 2-5/6 (NEEDLE) IMPLANT
ELECT NEEDLE BLADE 2-5/6 (NEEDLE) IMPLANT
ELECT REM PT RETURN 9FT ADLT (ELECTROSURGICAL) ×1
ELECTRODE REM PT RTRN 9FT ADLT (ELECTROSURGICAL) ×1 IMPLANT
FORCEPS BIPOLAR SPETZLER 8 1.0 (NEUROSURGERY SUPPLIES) IMPLANT
GAUZE 4X4 16PLY ~~LOC~~+RFID DBL (SPONGE) IMPLANT
GAUZE SPONGE 2X2 STRL 8-PLY (GAUZE/BANDAGES/DRESSINGS) IMPLANT
GAUZE SPONGE 4X4 12PLY STRL LF (GAUZE/BANDAGES/DRESSINGS) IMPLANT
GLOVE BIO SURGEON STRL SZ7.5 (GLOVE) ×1 IMPLANT
GLOVE BIOGEL PI IND STRL 8 (GLOVE) ×1 IMPLANT
GOWN STRL REUS W/ TWL LRG LVL3 (GOWN DISPOSABLE) ×1 IMPLANT
GOWN STRL REUS W/ TWL XL LVL3 (GOWN DISPOSABLE) ×1 IMPLANT
GOWN STRL REUS W/TWL LRG LVL3 (GOWN DISPOSABLE) ×1
GOWN STRL REUS W/TWL XL LVL3 (GOWN DISPOSABLE) ×1
MARKER SKIN DUAL TIP RULER LAB (MISCELLANEOUS) IMPLANT
NDL HYPO 27GX1-1/4 (NEEDLE) ×1 IMPLANT
NEEDLE HYPO 27GX1-1/4 (NEEDLE) ×1 IMPLANT
NS IRRIG 1000ML POUR BTL (IV SOLUTION) ×1 IMPLANT
PACK BASIN DAY SURGERY FS (CUSTOM PROCEDURE TRAY) ×1 IMPLANT
PENCIL SMOKE EVACUATOR (MISCELLANEOUS) ×1 IMPLANT
SHEET MEDIUM DRAPE 40X70 STRL (DRAPES) IMPLANT
SLEEVE SCD COMPRESS KNEE MED (STOCKING) IMPLANT
SPIKE FLUID TRANSFER (MISCELLANEOUS) IMPLANT
SUCTION FRAZIER HANDLE 10FR (MISCELLANEOUS)
SUCTION TUBE FRAZIER 10FR DISP (MISCELLANEOUS) IMPLANT
SUT BONE WAX W31G (SUTURE) IMPLANT
SUT ETHILON 6 0 PS 3 18 (SUTURE) IMPLANT
SUT NYLON ETHILON 5-0 P-3 1X18 (SUTURE) IMPLANT
SUT PROLENE 4 0 P 3 18 (SUTURE) IMPLANT
SUT VIC AB 4-0 P-3 18XBRD (SUTURE) IMPLANT
SUT VIC AB 4-0 P3 18 (SUTURE)
SUT VIC AB 4-0 PS2 18 (SUTURE) IMPLANT
SUT VIC AB 4-0 PS2 27 (SUTURE) IMPLANT
SYR BULB EAR ULCER 3OZ GRN STR (SYRINGE) ×1 IMPLANT
SYR CONTROL 10ML LL (SYRINGE) ×1 IMPLANT
TOWEL GREEN STERILE FF (TOWEL DISPOSABLE) ×1 IMPLANT
TRAY DSU PREP LF (CUSTOM PROCEDURE TRAY) ×1 IMPLANT
TUBE CONNECTING 20X1/4 (TUBING) ×1 IMPLANT
YANKAUER SUCT BULB TIP NO VENT (SUCTIONS) IMPLANT

## 2023-03-25 NOTE — Anesthesia Procedure Notes (Signed)
Procedure Name: Intubation Date/Time: 03/25/2023 10:42 AM  Performed by: Burna Cash, CRNAPre-anesthesia Checklist: Patient identified, Emergency Drugs available, Suction available and Patient being monitored Patient Re-evaluated:Patient Re-evaluated prior to induction Oxygen Delivery Method: Circle system utilized Preoxygenation: Pre-oxygenation with 100% oxygen Induction Type: IV induction Ventilation: Mask ventilation with difficulty Laryngoscope Size: Mac and 4 Grade View: Grade II Tube type: Oral Tube size: 7.5 mm Number of attempts: 1 Airway Equipment and Method: Stylet and Oral airway Placement Confirmation: ETT inserted through vocal cords under direct vision, positive ETCO2 and breath sounds checked- equal and bilateral Secured at: 22 cm Tube secured with: Tape Dental Injury: Teeth and Oropharynx as per pre-operative assessment

## 2023-03-25 NOTE — Anesthesia Preprocedure Evaluation (Addendum)
Anesthesia Evaluation  Patient identified by MRN, date of birth, ID band Patient awake    Reviewed: Allergy & Precautions, NPO status , Patient's Chart, lab work & pertinent test results  History of Anesthesia Complications Negative for: history of anesthetic complications  Airway Mallampati: II  TM Distance: >3 FB Neck ROM: Full    Dental no notable dental hx.    Pulmonary former smoker   Pulmonary exam normal        Cardiovascular negative cardio ROS Normal cardiovascular exam     Neuro/Psych   Anxiety        GI/Hepatic Neg liver ROS,GERD  Medicated and Controlled,,  Endo/Other  negative endocrine ROS    Renal/GU negative Renal ROS     Musculoskeletal negative musculoskeletal ROS (+)    Abdominal   Peds  Hematology negative hematology ROS (+)   Anesthesia Other Findings Osteoma of right frontal skull  Reproductive/Obstetrics                             Anesthesia Physical Anesthesia Plan  ASA: 2  Anesthesia Plan: General   Post-op Pain Management: Tylenol PO (pre-op)*   Induction: Intravenous  PONV Risk Score and Plan: 2 and Ondansetron, Dexamethasone, Treatment may vary due to age or medical condition and Midazolam  Airway Management Planned: Oral ETT  Additional Equipment: None  Intra-op Plan:   Post-operative Plan: Extubation in OR  Informed Consent: I have reviewed the patients History and Physical, chart, labs and discussed the procedure including the risks, benefits and alternatives for the proposed anesthesia with the patient or authorized representative who has indicated his/her understanding and acceptance.     Dental advisory given  Plan Discussed with: CRNA  Anesthesia Plan Comments:        Anesthesia Quick Evaluation

## 2023-03-25 NOTE — Anesthesia Postprocedure Evaluation (Signed)
Anesthesia Post Note  Patient: Tyler Griffin  Procedure(s) Performed: EXCISION OF FRONTAL SKULL OSTEOMA (Right: Head)     Patient location during evaluation: PACU Anesthesia Type: General Level of consciousness: awake and alert Pain management: pain level controlled Vital Signs Assessment: post-procedure vital signs reviewed and stable Respiratory status: spontaneous breathing, nonlabored ventilation and respiratory function stable Cardiovascular status: blood pressure returned to baseline Postop Assessment: no apparent nausea or vomiting Anesthetic complications: no   No notable events documented.  Last Vitals:  Vitals:   03/25/23 1130 03/25/23 1135  BP: (!) 140/96 (!) 141/95  Pulse: (!) 110 (!) 105  Resp: 20 16  Temp:    SpO2: 96% 94%    Last Pain:  Vitals:   03/25/23 1135  TempSrc:   PainSc: 4                  Shanda Howells

## 2023-03-25 NOTE — Op Note (Signed)
FACIAL PLASTIC SURGERY OPERATIVE NOTE  [facility] Date/Time of Admission: 03/25/2023  8:44 AM  CSN: [id number] Attending Provider: Scarlette Ar, MD Room/Bed: [facility]SP/NONE DOB: [birth year] Age: 42 y.o.   Pre-Op Diagnosis: Osteoma of right frontal skull  Post-Op Diagnosis: Osteoma of right frontal skull  Procedure: Procedure(s): EXCISION OF FRONTAL SKULL OSTEOMA  Anesthesia: General  Surgeon(s): Mervin Kung, MD  Staff: Circulator: Maryan Rued, RN Scrub Person: Lilia Argue J  Implants: * No implants in log *  Specimens: ID Type Source Tests Collected by Time Destination  1 : skull osteoma Tissue PATH Other SURGICAL PATHOLOGY Scarlette Ar, MD 03/25/2023 1102     Complications: NONE  EBL: 5 ML  IVF: Per anesthesia ML  Condition: stable  Operative Findings:  Frontal skull osteoma completely excised  Haiku photo:     Description of Operation:  The patient was identified in the preoperative area and consent confirmed in the chart.  He was brought to the operating room by the anesthetist and a preoperative huddle was performed confirming the patient identity and procedure to be performed.  Once all were in agreement we proceeded with surgery.  General anesthesia was induced the patient was intubated with laryngeal mask airway.  The tube was secured and the patient was turned 90 degrees from the anesthetist.  The patient's forehead was examined demonstrating approximately 1 cm osteoma of the mid central forehead.  A 1 cm incision was placed in a pre-existing skin crease overlying the osteoma.  The area was infiltrated with 2 cc of 1% lidocaine 1 100,000 epinephrine.  The patient was prepped and draped in standard sterile fashion for procedure of this kind.  A final preoperative pause was performed and we proceeded with surgery.  A 15 blade was used to incise the skin down to the level of the osteoma.  Double-pronged skin hooks were applied.   Subperiosteal dissection was performed with a Therapist, nutritional around the osteoma completely exposing it.  Bleeding was controlled from the scalp with judicious use of monopolar cautery.  Next an osteotome was used to remove the osteoma placing it between the outer table of the frontal bone and the osteoma.  The osteoma was completely removed.  Mild irregularity of the frontal bone was contoured with a 4 course diamond bur.  The wounds were copiously irrigated with sterile saline and hemostasis confirmed.  The wound was closed in a layered fashion with buried interrupted 4-0 Vicryl for the galea layer and interrupted 6-0 nylon for the skin.  The wounds were dressed with bacitracin, Telfa and brown paper tape.  Patient was then turned back to the anesthetist to extubated and brought to the recovery room in stable condition.   Mervin Kung, MD [facility]  03/25/2023

## 2023-03-25 NOTE — H&P (Signed)
Tyler Griffin is an 42 y.o. male.    Chief Complaint:  Frontal skull osteoma  HPI: Patient presents today for planned elective procedure.  He/she denies any interval change in history since office visit on 02/24/23.  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 His smoking use included cigars and cigarettes. He smoked an average of .5 packs per day. He has never used smokeless tobacco. He reports current alcohol use. He reports that he does not use drugs.  Allergies: No Known Allergies  Medications Prior to Admission  Medication Sig Dispense Refill   ALPRAZolam (XANAX) 0.25 MG tablet Take 1 tablet (0.25 mg total) by mouth 2 (two) times daily as needed for anxiety. 30 tablet 2   fenofibrate (TRICOR) 145 MG tablet Take 1 tablet (145 mg total) by mouth daily. 90 tablet 3   pantoprazole (PROTONIX) 40 MG tablet Take 1 tablet (40 mg total) by mouth daily. 90 tablet 3    No results found for this or any previous visit (from the past 48 hour(s)). No results found.  ROS: negative other than stated in HPI  Blood pressure 127/75, pulse 74, temperature 98.2 F (36.8 C), temperature source Temporal, resp. rate 18, height 6' (1.829 m), weight 125.1 kg, SpO2 98 %.  PHYSICAL EXAM: General: Resting comfortably in NAD  Lungs: Non-labored respiratinos  Studies Reviewed:  CT Maxillofacial 03/04/23  FINDINGS:  Osseous: Limited images  of the paranasal sinuses are obtained.  Somewhat hypoplastic appearance of the right maxillary antrum. No  evidence of acute fracture or dislocation of the facial bones. Focal  sclerotic lesion along the external table of the supraorbital region  to right of midline consistent with an osteoma.   Orbits: The globes and extraocular muscles appear intact and  symmetrical.   Sinuses: Paranasal sinuses are clear. Concha bullosa of the left  middle turbinate.   Soft tissues: Soft tissues are unremarkable.   Limited intracranial: No significant or unexpected finding.    Assessment/Plan Frontal skull osteoma Acquired facial deformity  Proceed with excision frontal skull osteoma, direct transcutaneous approach.  Informed consent obtained. R/B/A discussed.    Electronically signed by:  Scarlette Ar, MD  Staff Physician Facial Plastic & Reconstructive Surgery Otolaryngology - Head and Neck Surgery [facility]  03/25/2023, 9:25 AM

## 2023-03-25 NOTE — Transfer of Care (Signed)
Immediate Anesthesia Transfer of Care Note  Patient: Tyler Griffin  Procedure(s) Performed: EXCISION OF FRONTAL SKULL OSTEOMA (Right: Head)  Patient Location: PACU  Anesthesia Type:General  Level of Consciousness: awake, alert , and oriented  Airway & Oxygen Therapy: Patient Spontanous Breathing and Patient connected to face mask oxygen  Post-op Assessment: Report given to RN and Post -op Vital signs reviewed and stable  Post vital signs: Reviewed and stable  Last Vitals:  Vitals Value Taken Time  BP 144/86 03/25/23 1120  Temp 36.6 C 03/25/23 1120  Pulse 110 03/25/23 1121  Resp 15 03/25/23 1121  SpO2 95 % 03/25/23 1121  Vitals shown include unvalidated device data.  Last Pain:  Vitals:   03/25/23 0905  TempSrc: Temporal  PainSc: 0-No pain      Patients Stated Pain Goal: 1 (03/25/23 0905)  Complications: No notable events documented.

## 2023-03-25 NOTE — Discharge Instructions (Addendum)
Next dose of Tylenol at 3:15 today if needed  Post-operative Patient Instructions Tyler Griffin. Hoshal MD  Surgery What to expect: A bandage will be placed on your surgical sites. You can leave the bandages in place until you return to the clinic. You may be scheduled for a series of wound care appointments over the next month.  Recovery/Restrictions: -No strenuous activity for at least the first week after your procedure -Bruising and swelling are expected and will take weeks to go away (consider using ice packs) -Please contact our office immediately if you experience any signs/symptoms of infection (redness, pain, or fever of 100.35F or greater)  Wound Wound care: The goal is to keep your wounds clean and moist to prevent scabs or crusts. You will keep your current post-operative dressing in place undisturbed until after your first post-operative clinic visit. The following instructions apply after your first post-operative visit.  1. Clean wound wounds with soap and water using a cue tip if any crusts are present  2. Next, apply a thin layer of Aquaphor ointment 3. Apply Telfa and cover with brown tape   Care Healing Period: For the best healing, please protect the area from the sun   You may be asked to begin massaging the scars several weeks after surgery. Scars can be massaged in horizontal, vertical, and circular motions.   Adult Post-Operative Pain Management  Pain medication is given immediately following your surgery to help with post-operative pain. Do not wake up or set an alarm to wake up and take pain medications. Sleep and rest.  Upon your discharge home, we suggest scheduled doses of Acetaminophen (Tylenol) every 6 hours and Ibuprofen (Motrin) every 6 hours, alternating between medications every 3 hours (i.e. Take Tylenol and wait 3 hours, then take Motrin and wait 3 hours, repeat) for the first 3-4 days after surgery. If you are without significant pain, medications can  be taken more infrequently. It is important to follow dosing instructions on the medication bottle or prescription.   Sample of medication dosing schedule  Give dose of: Time: Given:  Acetaminophen 12 a.m.   Ibuprofen 3 a.m.   Acetaminophen 6 a.m.   Ibuprofen 9 a.m.   Acetaminophen 12 p.m.   Ibuprofen 3 p.m.   Acetaminophen 6 p.m.   Ibuprofen 9 p.m.    If you need to call after clinic hours for a concern, call [phone] and ask for the "physician on call for ENT."  [address] [place], [place] [address] Phone: [phone]    Post Anesthesia Home Care Instructions  Activity: Get plenty of rest for the remainder of the day. A responsible individual must stay with you for 24 hours following the procedure.  For the next 24 hours, DO NOT: -Drive a car -Advertising copywriter -Drink alcoholic beverages -Take any medication unless instructed by your physician -Make any legal decisions or sign important papers.  Meals: Start with liquid foods such as gelatin or soup. Progress to regular foods as tolerated. Avoid greasy, spicy, heavy foods. If nausea and/or vomiting occur, drink only clear liquids until the nausea and/or vomiting subsides. Call your physician if vomiting continues.  Special Instructions/Symptoms: Your throat may feel dry or sore from the anesthesia or the breathing tube placed in your throat during surgery. If this causes discomfort, gargle with warm salt water. The discomfort should disappear within 24 hours.  If you had a scopolamine patch placed behind your ear for the management of post- operative nausea and/or vomiting:  1. The medication in the patch is effective for 72 hours, after which it should be removed.  Wrap patch in a tissue and discard in the trash. Wash hands thoroughly with soap and water. 2. You may remove the patch earlier than 72 hours if you experience unpleasant side effects which may include dry mouth, dizziness or visual  disturbances. 3. Avoid touching the patch. Wash your hands with soap and water after contact with the patch.

## 2023-03-26 ENCOUNTER — Encounter (HOSPITAL_BASED_OUTPATIENT_CLINIC_OR_DEPARTMENT_OTHER): Payer: Self-pay | Admitting: Otolaryngology

## 2023-03-27 LAB — SURGICAL PATHOLOGY

## 2023-03-28 ENCOUNTER — Other Ambulatory Visit: Payer: Self-pay | Admitting: Internal Medicine

## 2023-04-01 DIAGNOSIS — D164 Benign neoplasm of bones of skull and face: Secondary | ICD-10-CM | POA: Diagnosis not present

## 2023-04-23 ENCOUNTER — Encounter: Payer: Self-pay | Admitting: Internal Medicine

## 2023-04-23 ENCOUNTER — Ambulatory Visit (INDEPENDENT_AMBULATORY_CARE_PROVIDER_SITE_OTHER): Payer: BC Managed Care – PPO | Admitting: Internal Medicine

## 2023-04-23 VITALS — BP 122/78 | HR 86 | Temp 98.6°F | Ht 72.0 in | Wt 272.0 lb

## 2023-04-23 DIAGNOSIS — E785 Hyperlipidemia, unspecified: Secondary | ICD-10-CM

## 2023-04-23 DIAGNOSIS — F419 Anxiety disorder, unspecified: Secondary | ICD-10-CM

## 2023-04-23 DIAGNOSIS — Z125 Encounter for screening for malignant neoplasm of prostate: Secondary | ICD-10-CM | POA: Diagnosis not present

## 2023-04-23 DIAGNOSIS — E559 Vitamin D deficiency, unspecified: Secondary | ICD-10-CM

## 2023-04-23 DIAGNOSIS — Z0001 Encounter for general adult medical examination with abnormal findings: Secondary | ICD-10-CM | POA: Diagnosis not present

## 2023-04-23 DIAGNOSIS — E538 Deficiency of other specified B group vitamins: Secondary | ICD-10-CM

## 2023-04-23 DIAGNOSIS — R739 Hyperglycemia, unspecified: Secondary | ICD-10-CM

## 2023-04-23 LAB — BASIC METABOLIC PANEL
BUN: 12 mg/dL (ref 6–23)
CO2: 28 mEq/L (ref 19–32)
Calcium: 9.6 mg/dL (ref 8.4–10.5)
Chloride: 102 mEq/L (ref 96–112)
Creatinine, Ser: 0.76 mg/dL (ref 0.40–1.50)
GFR: 112.65 mL/min (ref >60.00)
Glucose, Bld: 128 mg/dL — ABNORMAL HIGH (ref 70–99)
Potassium: 4.2 mEq/L (ref 3.5–5.1)
Sodium: 139 mEq/L (ref 135–145)

## 2023-04-23 LAB — URINALYSIS, ROUTINE W REFLEX MICROSCOPIC
Bilirubin Urine: NEGATIVE
Hgb urine dipstick: NEGATIVE
Ketones, ur: NEGATIVE
Leukocytes,Ua: NEGATIVE
Nitrite: NEGATIVE
RBC / HPF: NONE SEEN (ref 0–?)
Specific Gravity, Urine: <=1.005 — AB (ref 1.000–1.030)
Total Protein, Urine: NEGATIVE
Urine Glucose: NEGATIVE
Urobilinogen, UA: 0.2 (ref 0.0–1.0)
pH: 6 (ref 5.0–8.0)

## 2023-04-23 LAB — HEPATIC FUNCTION PANEL
ALT: 25 U/L (ref 0–53)
AST: 23 U/L (ref 0–37)
Albumin: 4.7 g/dL (ref 3.5–5.2)
Alkaline Phosphatase: 80 U/L (ref 39–117)
Bilirubin, Direct: 0.1 mg/dL (ref 0.0–0.3)
Total Bilirubin: 0.6 mg/dL (ref 0.2–1.2)
Total Protein: 7.2 g/dL (ref 6.0–8.3)

## 2023-04-23 LAB — CBC WITH DIFFERENTIAL/PLATELET
Basophils Absolute: 0 10*3/uL (ref 0.0–0.1)
Basophils Relative: 0.6 % (ref 0.0–3.0)
Eosinophils Absolute: 0.1 10*3/uL (ref 0.0–0.7)
Eosinophils Relative: 1.5 % (ref 0.0–5.0)
HCT: 42.3 % (ref 39.0–52.0)
Hemoglobin: 14.2 g/dL (ref 13.0–17.0)
Lymphocytes Relative: 23.2 % (ref 12.0–46.0)
Lymphs Abs: 1.5 10*3/uL (ref 0.7–4.0)
MCHC: 33.6 g/dL (ref 30.0–36.0)
MCV: 86.8 fl (ref 78.0–100.0)
Monocytes Absolute: 0.4 10*3/uL (ref 0.1–1.0)
Monocytes Relative: 6.5 % (ref 3.0–12.0)
Neutro Abs: 4.4 10*3/uL (ref 1.4–7.7)
Neutrophils Relative %: 68.2 % (ref 43.0–77.0)
Platelets: 249 10*3/uL (ref 150.0–400.0)
RBC: 4.87 Mil/uL (ref 4.22–5.81)
RDW: 12.9 % (ref 11.5–15.5)
WBC: 6.5 10*3/uL (ref 4.0–10.5)

## 2023-04-23 LAB — LIPID PANEL
Cholesterol: 215 mg/dL — ABNORMAL HIGH (ref 0–200)
HDL: 36.3 mg/dL — ABNORMAL LOW (ref >39.00)
NonHDL: 179.18
Total CHOL/HDL Ratio: 6
Triglycerides: 299 mg/dL — ABNORMAL HIGH (ref 0.0–149.0)
VLDL: 59.8 mg/dL — ABNORMAL HIGH (ref 0.0–40.0)

## 2023-04-23 LAB — VITAMIN B12: Vitamin B-12: 309 pg/mL (ref 211–911)

## 2023-04-23 LAB — VITAMIN D 25 HYDROXY (VIT D DEFICIENCY, FRACTURES): VITD: 29.93 ng/mL — ABNORMAL LOW (ref 30.00–100.00)

## 2023-04-23 LAB — TSH: TSH: 1.89 u[IU]/mL (ref 0.35–5.50)

## 2023-04-23 LAB — PSA: PSA: 0.48 ng/mL (ref 0.10–4.00)

## 2023-04-23 LAB — HEMOGLOBIN A1C: Hgb A1c MFr Bld: 6.2 % (ref 4.6–6.5)

## 2023-04-23 LAB — LDL CHOLESTEROL, DIRECT: Direct LDL: 143 mg/dL

## 2023-04-23 MED ORDER — CITALOPRAM HYDROBROMIDE 10 MG PO TABS: 10.0000 mg | ORAL_TABLET | Freq: Every day | ORAL | 3 refills | Status: DC

## 2023-04-23 MED ORDER — PANTOPRAZOLE SODIUM 40 MG PO TBEC: 40.0000 mg | DELAYED_RELEASE_TABLET | Freq: Every day | ORAL | 3 refills | Status: DC

## 2023-04-23 MED ORDER — ALPRAZOLAM 0.25 MG PO TABS: 0.2500 mg | ORAL_TABLET | Freq: Two times a day (BID) | ORAL | 2 refills | Status: DC | PRN

## 2023-04-23 MED ORDER — FENOFIBRATE 145 MG PO TABS: 145.0000 mg | ORAL_TABLET | Freq: Every day | ORAL | 3 refills | Status: DC

## 2023-04-23 NOTE — Progress Notes (Signed)
Patient ID: Tyler Griffin, male   DOB: 06/15/1981, 42 y.o.   MRN: [id number]         Chief Complaint:: wellness exam and anxiety, low vit d, hld, hyperglycemia       HPI:  Tyler Griffin is a 42 y.o. male here for wellness exam; declines covid booster, o/w up to date                        Also Pt denies chest pain, increased sob or doe, wheezing, orthopnea, PND, increased LE swelling, palpitations, dizziness or syncope.   Pt denies polydipsia, polyuria, or new focal neuro s/s.    Pt denies fever, wt loss, night sweats, loss of appetite, or other constitutional symptoms  Denies worsening depressive symptoms, suicidal ideation, or panic; has ongoing anxiety, recently worsening with multiple social stressors.     Wt Readings from Last 3 Encounters:  04/23/23 272 lb (123.4 kg)  03/25/23 275 lb 12.7 oz (125.1 kg)  04/04/22 268 lb (121.6 kg)   BP Readings from Last 3 Encounters:  04/23/23 122/78  03/25/23 (!) 141/95  04/04/22 124/80   Immunization History  Administered Date(s) Administered   Influenza,inj,Quad PF,6+ Mos 11/20/2020   Influenza-Unspecified 11-06-1981   PFIZER(Purple Top)SARS-COV-2 Vaccination 04/12/2019, 03/12/2020, 08/13/2020, 11/20/2020   Tdap 03/08/2015  There are no preventive care reminders to display for this patient.    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 His smoking use included cigars and cigarettes. He smoked an average of .5 packs per day. He has never used smokeless tobacco. He reports current alcohol use. He reports that he does not use drugs. family history includes Colon cancer in an other family member; Heart disease in his father. No Known Allergies No current outpatient medications on file prior to visit.   No current facility-administered medications on file prior to visit.        ROS:  All others reviewed and negative.  Objective        PE:  BP 122/78 (BP Location: Left Arm, Patient Position: Sitting, Cuff Size: Normal)   Pulse 86   Temp 98.6 F (37 C) (Oral)   Ht 6' (1.829 m)   Wt 272 lb (123.4 kg)   SpO2 98%   BMI 36.89 kg/m                 Constitutional: Pt appears in NAD               HENT: Head: NCAT.                Right Ear: External ear normal.                 Left Ear: External ear normal.  Eyes: . Pupils are equal, round, and reactive to light. Conjunctivae and EOM are normal               Nose: without d/c or deformity               Neck: Neck supple. Gross normal ROM               Cardiovascular: Normal rate and regular rhythm.                 Pulmonary/Chest: Effort normal and breath sounds without rales or wheezing.                Abd:  Soft, NT, ND, + BS, no organomegaly               Neurological: Pt is alert. At baseline orientation, motor grossly intact               Skin: Skin is warm. No rashes, no other new lesions, LE edema - none               Psychiatric: Pt behavior is normal without agitation; 2+ nervous   Micro: none  Cardiac tracings I have personally interpreted today:  none  Pertinent Radiological findings (summarize): none   Lab Results  Component Value Date   WBC 6.5 04/23/2023   HGB 14.2 04/23/2023   HCT 42.3 04/23/2023   PLT 249.0 04/23/2023   GLUCOSE 128 (H) 04/23/2023   CHOL 215 (H) 04/23/2023   TRIG 299.0 (H) 04/23/2023   HDL 36.30 (L)  04/23/2023   LDLDIRECT 143.0 04/23/2023   LDLCALC 165 (H) 01/26/2020   ALT 25 04/23/2023   AST 23 04/23/2023   NA 139 04/23/2023   K 4.2 04/23/2023   CL 102 04/23/2023   CREATININE 0.76 04/23/2023   BUN 12 04/23/2023   CO2 28 04/23/2023   TSH 1.89 04/23/2023   PSA 0.48 04/23/2023   HGBA1C 6.2 04/23/2023   Assessment/Plan:  Tyler Griffin is a 42 y.o. 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  Encounter for well adult exam with abnormal findings Age and sex appropriate education and counseling updated with regular exercise and diet Referrals for preventative services - none needed Immunizations addressed - declines covid booster Smoking counseling  - none needed Evidence for depression or other mood disorder - none significant Most recent labs reviewed. I have personally reviewed and have noted: 1) the patient's medical and social history 2) The patient's current medications and supplements 3) The patient's height, weight, and BMI have been recorded in the chart   Dyslipidemia Lab Results  Component Value Date   LDLCALC 165 (H) 01/26/2020   Uncontrolled, to start crestor 10 every day, lower chol diet   Anxiety Worsening recently, for start celexa 10 every day, refill xanax prn  Hyperglycemia Lab Results  Component Value Date   HGBA1C 6.2 04/23/2023   Stable, pt to continue current medical treatment  - diet, wt control   Vitamin D deficiency Last vitamin D Lab Results  Component Value Date   VD25OH 29.93 (L) 04/23/2023   Low, to start oral replacement  Followup: Return in about 1 year (around 04/22/2024).  Oliver Barre, MD 04/25/2023 4:49 PM [facility]

## 2023-04-23 NOTE — Patient Instructions (Addendum)
Please take all new medication as prescribed - the celexa 10 mg per day  Please continue all other medications as before, and refills have been done if requested - the xanax  Please have the pharmacy call with any other refills you may need.  Please continue your efforts at being more active, low cholesterol diet, and weight control.  You are otherwise up to date with prevention measures today.  Please keep your appointments with your specialists as you may have planned  Please go to the LAB at the blood drawing area for the tests to be done  You will be contacted by phone if any changes need to be made immediately.  Otherwise, you will receive a letter about your results with an explanation, but please check with MyChart first.  Please remember to sign up for MyChart if you have not done so, as this will be important to you in the future with finding out test results, communicating by private email, and scheduling acute appointments online when needed.  Please make an Appointment to return for your 1 year visit, or sooner if needed

## 2023-04-24 MED ORDER — ROSUVASTATIN CALCIUM 10 MG PO TABS: 10.0000 mg | ORAL_TABLET | Freq: Every day | ORAL | 3 refills | Status: DC

## 2023-04-25 ENCOUNTER — Encounter: Payer: Self-pay | Admitting: Internal Medicine

## 2023-04-25 NOTE — Assessment & Plan Note (Signed)
Last vitamin D Lab Results  Component Value Date   VD25OH 29.93 (L) 04/23/2023   Low, to start oral replacement

## 2023-04-25 NOTE — Assessment & Plan Note (Signed)
Lab Results  Component Value Date   HGBA1C 6.2 04/23/2023   Stable, pt to continue current medical treatment  - diet, wt control

## 2023-04-25 NOTE — Assessment & Plan Note (Signed)
Worsening recently, for start celexa 10 every day, refill xanax prn

## 2023-04-25 NOTE — Assessment & Plan Note (Signed)

## 2023-04-25 NOTE — Assessment & Plan Note (Signed)
Lab Results  Component Value Date   LDLCALC 165 (H) 01/26/2020   Uncontrolled, to start crestor 10 every day, lower chol diet

## 2023-06-29 DIAGNOSIS — D164 Benign neoplasm of bones of skull and face: Secondary | ICD-10-CM | POA: Diagnosis not present

## 2023-06-29 DIAGNOSIS — J383 Other diseases of vocal cords: Secondary | ICD-10-CM | POA: Diagnosis not present

## 2023-06-29 DIAGNOSIS — K219 Gastro-esophageal reflux disease without esophagitis: Secondary | ICD-10-CM | POA: Diagnosis not present

## 2023-09-26 DIAGNOSIS — J209 Acute bronchitis, unspecified: Secondary | ICD-10-CM | POA: Diagnosis not present

## 2023-10-10 ENCOUNTER — Encounter (HOSPITAL_BASED_OUTPATIENT_CLINIC_OR_DEPARTMENT_OTHER): Payer: Self-pay

## 2023-10-10 ENCOUNTER — Other Ambulatory Visit: Payer: Self-pay

## 2023-10-10 ENCOUNTER — Emergency Department (HOSPITAL_BASED_OUTPATIENT_CLINIC_OR_DEPARTMENT_OTHER)
Admission: EM | Admit: 2023-10-10 | Discharge: 2023-10-10 | Disposition: A | Discharge status: Home / Self Care | Payer: BC Managed Care – PPO | Attending: Emergency Medicine | Admitting: Emergency Medicine

## 2023-10-10 DIAGNOSIS — K644 Residual hemorrhoidal skin tags: Secondary | ICD-10-CM

## 2023-10-10 DIAGNOSIS — K6289 Other specified diseases of anus and rectum: Secondary | ICD-10-CM | POA: Diagnosis not present

## 2023-10-10 DIAGNOSIS — K645 Perianal venous thrombosis: Secondary | ICD-10-CM | POA: Diagnosis not present

## 2023-10-10 MED ORDER — OXYCODONE-ACETAMINOPHEN 5-325 MG PO TABS
1.0000 | ORAL_TABLET | Freq: Once | ORAL | Status: AC
  Administered 2023-10-10: 1 via ORAL
  Filled 2023-10-10: qty 1

## 2023-10-10 MED ORDER — OXYCODONE-ACETAMINOPHEN 5-325 MG PO TABS: 1.0000 | ORAL_TABLET | Freq: Four times a day (QID) | ORAL | 0 refills | Status: DC | PRN

## 2023-10-10 MED ORDER — HYDROMORPHONE HCL 1 MG/ML IJ SOLN
1.0000 mg | Freq: Once | INTRAMUSCULAR | Status: AC
  Administered 2023-10-10: 1 mg via INTRAMUSCULAR
  Filled 2023-10-10: qty 1

## 2023-10-10 NOTE — Discharge Instructions (Addendum)
You were seen in the ER today for evaluation of your hemorrhoids. I think this will likely need surgical evaluation. I have included the information for [facility] for you to call to schedule an appointment with.  In the meantime, continue doing the hydrocortisone cream, topical lidocaine, and sitz bath's.  For pain, recommend taking sister to 650 mg of Tylenol and/or 600 and milligrams of ibuprofen every 6 hours as needed for pain.  I have also prescribed you some narcotic pain medication to take as needed for breakthrough pain.  Please do not drive or operate any heavy machinery while on this medication as it will make you sleepy.  I also recommend adding on MiraLAX as narcotics can cause constipation which can worsen your pain.  If you start to have worsening pain, fever, worsening bleeding, purulent drainage, please return to your nearest for department for evaluation.  If you have any concerns, new or worsening symptoms, please return to your nearest emerged department for reevaluation.  Contact a doctor if: You have pain and swelling that do not get better with treatment. You have trouble pooping. You cannot poop. You have pain or swelling outside the area of the hemorrhoids. Get help right away if: You have bleeding from the butt that will not stop.

## 2023-10-10 NOTE — ED Triage Notes (Signed)
Pt to triage c/o rectal pain 10/10 burning in nature x 8 hours resulting from possible hemorrhoid. Pt report Hx of hemorrhoids and says this feels the same with bright red blood.  Pt VSS NAD PT on room air.

## 2023-10-11 NOTE — ED Provider Notes (Signed)
[place] EMERGENCY DEPARTMENT AT [facility] Provider Note   CSN: [id number] Arrival date & time: 10/10/23  1850     History Chief Complaint  Patient presents with   Hemorrhoids    Tyler Griffin is a 42 y.o. male with h/o hemorrhoids and anal fissures presents to the ER for evaluation of rectal pain. The patient reports that last week he had a vigorous bowel movement, no constipation, and started to feel like he had a hemorrhoid again. No rectal bleeding or melena. Denies any abdominal pain. He reports that it was being managed with topical lidocaine, hydrocortisone, and Sitz baths. On Wednesday, he started to know that it was worsening, but then was intermittent. Today, he reports that he has a soft bowel movement because he was taking Miralax, no bleeding still. He reports that he went to sit in the car and had significant rectal pain. He has tried his tylenol, ibuprofen, lidocaine and sitz baths without relief. He reports that after wiping form a Sitz bath, he started to notice a small amount of blood. Denies any fevers. NKDA. Denies any tobacco, Etoh, or illicit drug use.   HPI     Home Medications Prior to Admission medications   Medication Sig Start Date End Date Taking? Authorizing Provider  oxyCODONE-acetaminophen (PERCOCET/ROXICET) 5-325 MG tablet Take 1 tablet by mouth every 6 (six) hours as needed for severe pain (pain score 7-10). 10/10/23  Yes Achille Rich, PA-C  ALPRAZolam Prudy Feeler) 0.25 MG tablet Take 1 tablet (0.25 mg total) by mouth 2 (two) times daily as needed for anxiety. 04/23/23   Corwin Levins, MD  citalopram (CELEXA) 10 MG tablet Take 1 tablet (10 mg total) by mouth daily. 04/23/23 04/22/24  Corwin Levins, MD  pantoprazole (PROTONIX) 40 MG tablet Take 1 tablet (40 mg total) by mouth daily. 04/23/23   Corwin Levins, MD  rosuvastatin (CRESTOR) 10 MG tablet Take 1 tablet (10 mg total) by mouth daily. 04/24/23   Corwin Levins, MD      Allergies    Patient has  no known allergies.    Review of Systems   Review of Systems  Constitutional:  Negative for chills and fever.  Gastrointestinal:  Positive for anal bleeding and rectal pain. Negative for abdominal pain, blood in stool, constipation, diarrhea, nausea and vomiting.    Physical Exam Updated Vital Signs BP 134/87   Pulse 90   Temp 97.8 F (36.6 C)   Resp 16   Wt 122.5 kg   SpO2 95%   BMI 36.62 kg/m  Physical Exam Vitals and nursing note reviewed. Exam conducted with a chaperone present Health visitor, RN).  Constitutional:      Comments: Uncomfortable, but not toxic appearing  Eyes:     General: No scleral icterus. Cardiovascular:     Rate and Rhythm: Normal rate.  Pulmonary:     Effort: Pulmonary effort is normal. No respiratory distress.  Abdominal:     Tenderness: There is no abdominal tenderness. There is no guarding or rebound.  Genitourinary:    Comments: Please see image. Large hemorrhoid present on the Left side of the rectum. Some thrombosis seen caudally. Small amount of blood present. Has some pain at he site of the hemorrhoid, but does not have any tenderness in the rectum on DRE. No induration or fluctuance present.  Skin:    General: Skin is warm and dry.  Neurological:     Mental Status: He is alert.     ED  Results / Procedures / Treatments   Labs (all labs ordered are listed, but only abnormal results are displayed) Labs Reviewed - No data to display  EKG None  Radiology No results found.  Procedures Procedures   Medications Ordered in ED Medications  HYDROmorphone (DILAUDID) injection 1 mg (1 mg Intramuscular Given 10/10/23 2102)  oxyCODONE-acetaminophen (PERCOCET/ROXICET) 5-325 MG per tablet 1 tablet (1 tablet Oral Given 10/10/23 2317)    ED Course/ Medical Decision Making/ A&P                                Medical Decision Making Risk Prescription drug management.   42 y.o. male presents to the ER today for evaluation of rectal pain.  Differential diagnosis includes but is not limited to hemorrhoid, thrombosed hemorrhoid, abscess, fistula. Vital signs show initially tachycardic, but did improve. Otherwise unremarkable. Physical exam as noted above.   Patient was given Dilaudid and tachycardia improved.  On evaluation, patient has a large hemorrhoid that is partially thrombosed but is very close to the sphincter.  There is no induration or fluctuance.  Does not need to be any rectum involvement.  There is some minimal bleeding noted.  Patient reports that he is had a thrombectomy done before at a primary care office that ended up turning into a fissure that required him to have surgery by [facility] surgery.  He would rather not have a bedside procedure.  My attending assessed at bedside and recommends follow-up subcarina surgery and does not recommend a bedside procedure given the close extremity to the sphincter.  Does not think we need do any CT imaging as this is likely a hemorrhoid.  The patient is finding some relief with the Dilaudid.  We have also given him at an on Percocet and Percocet prescription to go home with.  He understands the risks of constipation with this and to take MiraLAX.  Patient's wife works with surgery and feels that she can get him a sooner appointment as well.  The information for [facility] surgery was given to the discharge paperwork.  I discussed with him to continue using the sitz bath's as well as a topical lidocaine hydrocortisone cream.  We discussed pain management as well as need to follow-up.  We discussed return precautions as well.  Patient stable for discharge home with close outpatient follow-up and strict return precautions.  We discussed plan at bedside. We discussed strict return precautions and red flag symptoms. The patient verbalized their understanding and agrees to the plan. The patient is stable and being discharged home in good condition.  Portions of this report may have  been transcribed using voice recognition software. Every effort was made to ensure accuracy; however, inadvertent computerized transcription errors may be present.   I discussed this case with my attending physician who cosigned this note including patient's presenting symptoms, physical exam, and planned diagnostics and interventions. Attending physician stated agreement with plan or made changes to plan which were implemented.   Attending physician assessed patient at bedside.  Final Clinical Impression(s) / ED Diagnoses Final diagnoses:  External hemorrhoid    Rx / DC Orders ED Discharge Orders          Ordered    oxyCODONE-acetaminophen (PERCOCET/ROXICET) 5-325 MG tablet  Every 6 hours PRN        10/10/23 2309              Achille Rich, PA-C  10/11/23 0041    Laurence Spates, MD 10/12/23 [phone]

## 2023-10-12 DIAGNOSIS — K600 Acute anal fissure: Secondary | ICD-10-CM | POA: Diagnosis not present

## 2023-10-12 DIAGNOSIS — K645 Perianal venous thrombosis: Secondary | ICD-10-CM | POA: Diagnosis not present

## 2023-10-12 DIAGNOSIS — Z9889 Other specified postprocedural states: Secondary | ICD-10-CM | POA: Diagnosis not present

## 2023-11-17 DIAGNOSIS — D164 Benign neoplasm of bones of skull and face: Secondary | ICD-10-CM | POA: Diagnosis not present

## 2023-11-17 DIAGNOSIS — J383 Other diseases of vocal cords: Secondary | ICD-10-CM | POA: Diagnosis not present

## 2023-11-17 DIAGNOSIS — K219 Gastro-esophageal reflux disease without esophagitis: Secondary | ICD-10-CM | POA: Diagnosis not present

## 2023-12-22 DIAGNOSIS — Z9889 Other specified postprocedural states: Secondary | ICD-10-CM | POA: Diagnosis not present

## 2023-12-22 DIAGNOSIS — K645 Perianal venous thrombosis: Secondary | ICD-10-CM | POA: Diagnosis not present

## 2023-12-22 DIAGNOSIS — K600 Acute anal fissure: Secondary | ICD-10-CM | POA: Diagnosis not present

## 2023-12-22 DIAGNOSIS — K6289 Other specified diseases of anus and rectum: Secondary | ICD-10-CM | POA: Diagnosis not present

## 2024-03-01 ENCOUNTER — Ambulatory Visit: Payer: Self-pay | Admitting: Surgery

## 2024-03-01 DIAGNOSIS — Z9889 Other specified postprocedural states: Secondary | ICD-10-CM | POA: Diagnosis not present

## 2024-03-01 DIAGNOSIS — K603 Anal fistula, unspecified: Secondary | ICD-10-CM | POA: Diagnosis not present

## 2024-04-10 ENCOUNTER — Other Ambulatory Visit: Payer: Self-pay | Admitting: Internal Medicine

## 2024-04-11 ENCOUNTER — Other Ambulatory Visit: Payer: Self-pay

## 2024-04-17 DIAGNOSIS — H1033 Unspecified acute conjunctivitis, bilateral: Secondary | ICD-10-CM | POA: Diagnosis not present

## 2024-04-17 DIAGNOSIS — J209 Acute bronchitis, unspecified: Secondary | ICD-10-CM | POA: Diagnosis not present

## 2024-04-17 DIAGNOSIS — J00 Acute nasopharyngitis [common cold]: Secondary | ICD-10-CM | POA: Diagnosis not present

## 2024-05-09 ENCOUNTER — Ambulatory Visit: Payer: Self-pay | Admitting: Internal Medicine

## 2024-05-09 ENCOUNTER — Ambulatory Visit (INDEPENDENT_AMBULATORY_CARE_PROVIDER_SITE_OTHER): Admitting: Internal Medicine

## 2024-05-09 ENCOUNTER — Encounter: Payer: Self-pay | Admitting: Internal Medicine

## 2024-05-09 VITALS — BP 126/78 | HR 100 | Temp 98.3°F | Ht 72.0 in | Wt 278.0 lb

## 2024-05-09 DIAGNOSIS — J309 Allergic rhinitis, unspecified: Secondary | ICD-10-CM

## 2024-05-09 DIAGNOSIS — Z0001 Encounter for general adult medical examination with abnormal findings: Secondary | ICD-10-CM | POA: Insufficient documentation

## 2024-05-09 DIAGNOSIS — R739 Hyperglycemia, unspecified: Secondary | ICD-10-CM

## 2024-05-09 DIAGNOSIS — E785 Hyperlipidemia, unspecified: Secondary | ICD-10-CM

## 2024-05-09 DIAGNOSIS — Z125 Encounter for screening for malignant neoplasm of prostate: Secondary | ICD-10-CM | POA: Diagnosis not present

## 2024-05-09 DIAGNOSIS — E66811 Obesity, class 1: Secondary | ICD-10-CM

## 2024-05-09 DIAGNOSIS — E559 Vitamin D deficiency, unspecified: Secondary | ICD-10-CM | POA: Diagnosis not present

## 2024-05-09 DIAGNOSIS — E538 Deficiency of other specified B group vitamins: Secondary | ICD-10-CM | POA: Diagnosis not present

## 2024-05-09 DIAGNOSIS — Z6837 Body mass index (BMI) 37.0-37.9, adult: Secondary | ICD-10-CM

## 2024-05-09 DIAGNOSIS — F419 Anxiety disorder, unspecified: Secondary | ICD-10-CM

## 2024-05-09 LAB — CBC WITH DIFFERENTIAL/PLATELET
Basophils Absolute: 0 10*3/uL (ref 0.0–0.1)
Basophils Relative: 0.5 % (ref 0.0–3.0)
Eosinophils Absolute: 0.1 10*3/uL (ref 0.0–0.7)
Eosinophils Relative: 1.9 % (ref 0.0–5.0)
HCT: 42.1 % (ref 39.0–52.0)
Hemoglobin: 14.4 g/dL (ref 13.0–17.0)
Lymphocytes Relative: 16.9 % (ref 12.0–46.0)
Lymphs Abs: 1.1 10*3/uL (ref 0.7–4.0)
MCHC: 34.2 g/dL (ref 30.0–36.0)
MCV: 85.9 fl (ref 78.0–100.0)
Monocytes Absolute: 0.5 10*3/uL (ref 0.1–1.0)
Monocytes Relative: 7.3 % (ref 3.0–12.0)
Neutro Abs: 4.8 10*3/uL (ref 1.4–7.7)
Neutrophils Relative %: 73.4 % (ref 43.0–77.0)
Platelets: 214 10*3/uL (ref 150.0–400.0)
RBC: 4.9 Mil/uL (ref 4.22–5.81)
RDW: 13.1 % (ref 11.5–15.5)
WBC: 6.5 10*3/uL (ref 4.0–10.5)

## 2024-05-09 LAB — LIPID PANEL
Cholesterol: 171 mg/dL (ref 0–200)
HDL: 37.8 mg/dL — ABNORMAL LOW (ref >39.00)
NonHDL: 132.72
Total CHOL/HDL Ratio: 5
Triglycerides: 590 mg/dL — ABNORMAL HIGH (ref 0.0–149.0)
VLDL: 118 mg/dL — ABNORMAL HIGH (ref 0.0–40.0)

## 2024-05-09 LAB — URINALYSIS, ROUTINE W REFLEX MICROSCOPIC
Bilirubin Urine: NEGATIVE
Hgb urine dipstick: NEGATIVE
Ketones, ur: NEGATIVE
Leukocytes,Ua: NEGATIVE
Nitrite: NEGATIVE
Specific Gravity, Urine: >=1.03 — AB (ref 1.000–1.030)
Total Protein, Urine: 100 — AB
Urine Glucose: NEGATIVE
Urobilinogen, UA: 0.2 (ref 0.0–1.0)
pH: 6 (ref 5.0–8.0)

## 2024-05-09 LAB — BASIC METABOLIC PANEL WITH GFR
BUN: 11 mg/dL (ref 6–23)
CO2: 27 meq/L (ref 19–32)
Calcium: 9.4 mg/dL (ref 8.4–10.5)
Chloride: 97 meq/L (ref 96–112)
Creatinine, Ser: 0.67 mg/dL (ref 0.40–1.50)
GFR: 116.16 mL/min (ref >60.00)
Glucose, Bld: 175 mg/dL — ABNORMAL HIGH (ref 70–99)
Potassium: 3.8 meq/L (ref 3.5–5.1)
Sodium: 136 meq/L (ref 135–145)

## 2024-05-09 LAB — HEPATIC FUNCTION PANEL
ALT: 44 U/L (ref 0–53)
AST: 41 U/L — ABNORMAL HIGH (ref 0–37)
Albumin: 4.7 g/dL (ref 3.5–5.2)
Alkaline Phosphatase: 101 U/L (ref 39–117)
Bilirubin, Direct: 0.1 mg/dL (ref 0.0–0.3)
Total Bilirubin: 0.8 mg/dL (ref 0.2–1.2)
Total Protein: 7.2 g/dL (ref 6.0–8.3)

## 2024-05-09 LAB — VITAMIN B12: Vitamin B-12: 724 pg/mL (ref 211–911)

## 2024-05-09 LAB — TSH: TSH: 2.25 u[IU]/mL (ref 0.35–5.50)

## 2024-05-09 LAB — PSA: PSA: 0.38 ng/mL (ref 0.10–4.00)

## 2024-05-09 LAB — VITAMIN D 25 HYDROXY (VIT D DEFICIENCY, FRACTURES): VITD: 34.03 ng/mL (ref 30.00–100.00)

## 2024-05-09 LAB — LDL CHOLESTEROL, DIRECT: Direct LDL: 59 mg/dL

## 2024-05-09 LAB — HEMOGLOBIN A1C: Hgb A1c MFr Bld: 6.8 % — ABNORMAL HIGH (ref 4.6–6.5)

## 2024-05-09 MED ORDER — PANTOPRAZOLE SODIUM 40 MG PO TBEC: 40.0000 mg | DELAYED_RELEASE_TABLET | Freq: Every day | ORAL | 3 refills | Status: AC

## 2024-05-09 MED ORDER — CITALOPRAM HYDROBROMIDE 10 MG PO TABS: 10.0000 mg | ORAL_TABLET | Freq: Every day | ORAL | 3 refills | Status: AC

## 2024-05-09 MED ORDER — PHENTERMINE HCL 30 MG PO CAPS: 30.0000 mg | ORAL_CAPSULE | ORAL | 1 refills | Status: AC

## 2024-05-09 MED ORDER — ROSUVASTATIN CALCIUM 10 MG PO TABS: 10.0000 mg | ORAL_TABLET | Freq: Every day | ORAL | 3 refills | Status: AC

## 2024-05-09 MED ORDER — ALPRAZOLAM 0.25 MG PO TABS: 0.2500 mg | ORAL_TABLET | Freq: Two times a day (BID) | ORAL | 2 refills | Status: AC | PRN

## 2024-05-09 MED ORDER — OMEGA-3-ACID ETHYL ESTERS 1 G PO CAPS: 1.0000 g | ORAL_CAPSULE | Freq: Two times a day (BID) | ORAL | 11 refills | Status: AC

## 2024-05-09 NOTE — Patient Instructions (Signed)
 Please take all new medication as prescribed - the phentermine  for wt loss  Please continue all other medications as before, and refills have been done if requested.  Please have the pharmacy call with any other refills you may need.  Please continue your efforts at being more active, low cholesterol diet, and weight control.  You are otherwise up to date with prevention measures today.  Please keep your appointments with your specialists as you may have planned  Please go to the LAB at the blood drawing area for the tests to be done  You will be contacted by phone if any changes need to be made immediately.  Otherwise, you will receive a letter about your results with an explanation, but please check with MyChart first.  Please make an Appointment to return for your 1 year visit, or sooner if needed

## 2024-05-09 NOTE — Progress Notes (Signed)
 Patient ID: Tyler Griffin, male   DOB: 1980/12/11, 43 y.o.   MRN: [id number]         Chief Complaint:: wellness exam and low vit d, hld, allergies, anxiety, obesity        HPI:  Tyler Griffin is a 43 y.o. male here for wellness exam; up to date                Also seen recently at [facility] with allergy symptoms and bilateral pink eye with eye drops and zpack and resolved.  Denies worsening depressive symptoms, suicidal ideation, or panic; has ongoing anxiety, well enough controlled with the new citalopram  10 mg and xanax  prn.   Unable to lose significant wt, in fact keeps increasing.     Wt Readings from Last 3 Encounters:  05/09/24 278 lb (126.1 kg)  10/10/23 270 lb (122.5 kg)  04/23/23 272 lb (123.4 kg)   BP Readings from Last 3 Encounters:  05/09/24 126/78  10/10/23 134/87  04/23/23 122/78   Immunization History  Administered Date(s) Administered   Influenza,inj,Quad PF,6+ Mos 11/20/2020, 10/14/2021   Influenza-Unspecified 1980/12/09   PFIZER(Purple Top)SARS-COV-2 Vaccination 04/12/2019, 03/12/2020, 08/13/2020, 11/20/2020   Tdap 03/08/2015   Health Maintenance Due  Topic Date Due   Hepatitis B Vaccines (1 of 3 - 19+ 3-dose series) Never done   HPV VACCINES (1 - 3-dose SCDM series) Never done      Past Medical History:  Diagnosis Date   Anal fissure    underwent fissurotomy   Hemorrhoids    Laryngopharyngeal reflux (LPR)    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 His smoking use  included cigars and cigarettes. He has never used smokeless tobacco. He reports current alcohol use. He reports that he does not use drugs. family history includes Colon cancer in an other family member; Heart disease in his father. No Known Allergies No current outpatient medications on file prior to visit.   No current facility-administered medications on file prior to visit.        ROS:  All others reviewed and negative.  Objective        PE:  BP 126/78 (BP Location: Right Arm, Patient Position: Sitting, Cuff Size: Normal)   Pulse 100   Temp 98.3 F (36.8 C) (Oral)   Ht 6' (1.829 m)   Wt 278 lb (126.1 kg)   SpO2 99%   BMI 37.70 kg/m                 Constitutional: Pt appears in NAD               HENT: Head: NCAT.                Right Ear: External ear normal.                 Left Ear: External ear normal.                Eyes: .  Pupils are equal, round, and reactive to light. Conjunctivae and EOM are normal               Nose: without d/c or deformity               Neck: Neck supple. Gross normal ROM               Cardiovascular: Normal rate and regular rhythm.                 Pulmonary/Chest: Effort normal and breath sounds without rales or wheezing.                Abd:  Soft, NT, ND, + BS, no organomegaly               Neurological: Pt is alert. At baseline orientation, motor grossly intact               Skin: Skin is warm. No rashes, no other new lesions, LE edema - none               Psychiatric: Pt behavior is normal without agitation , mild nervous  Micro: none  Cardiac tracings I have personally interpreted today:  none  Pertinent Radiological findings (summarize): none   Lab Results  Component Value Date   WBC 6.5 05/09/2024   HGB 14.4 05/09/2024   HCT 42.1 05/09/2024   PLT 214.0 05/09/2024   GLUCOSE 175 (H) 05/09/2024   CHOL 171 05/09/2024   TRIG 590.0 (H) 05/09/2024   HDL 37.80 (L) 05/09/2024   LDLDIRECT 59.0 05/09/2024   LDLCALC 165 (H) 01/26/2020    ALT 44 05/09/2024   AST 41 (H) 05/09/2024   NA 136 05/09/2024   K 3.8 05/09/2024   CL 97 05/09/2024   CREATININE 0.67 05/09/2024   BUN 11 05/09/2024   CO2 27 05/09/2024   TSH 2.25 05/09/2024   PSA 0.38 05/09/2024   HGBA1C 6.8 (H) 05/09/2024   Assessment/Plan:  Tyler Griffin is a 43 y.o. 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  Encounter for well adult exam with abnormal findings Age and sex appropriate education and counseling updated with regular exercise and diet Referrals for preventative services - none needed Immunizations addressed - none needed Smoking counseling  - none needed Evidence for depression or other mood disorder - none significant Most recent labs reviewed. I have personally reviewed and have noted: 1) the patient's medical and social history 2) The patient's current medications and supplements 3) The patient's height, weight, and BMI have been recorded in the chart   Vitamin D  deficiency Last vitamin D  Lab Results  Component Value Date   VD25OH 34.03 05/09/2024   Low, to start oral replacement   Dyslipidemia Lab Results  Component Value Date   LDLCALC 165 (H) 01/26/2020   Severe uncontrolled, continue crestor  10 mg , but also add lovaza  1 gm bid for hyperglycerides   Anxiety Improved and stable, cont current med tx with citalopram  10 and xanax  rare use prn  Allergic rhinitis Improved after recent flare, to continue allegra 180 every day prn,  to f/u any worsening symptoms or concerns  Obesity (BMI 30.0-34.9) Worsening , cannot afford GLP1, now for phentermin 30 every day limited rx  Followup: Return in about 1 year (around 05/09/2025).  Lynwood Rush, MD 05/09/2024 9:00 PM [facility]  Primary Care -  [facility]

## 2024-05-09 NOTE — Assessment & Plan Note (Signed)
 Lab Results  Component Value Date   LDLCALC 165 (H) 01/26/2020   Severe uncontrolled, continue crestor  10 mg , but also add lovaza  1 gm bid for hyperglycerides

## 2024-05-09 NOTE — Assessment & Plan Note (Signed)
 Improved and stable, cont current med tx with citalopram  10 and xanax  rare use prn

## 2024-05-09 NOTE — Assessment & Plan Note (Signed)

## 2024-05-09 NOTE — Assessment & Plan Note (Signed)
 Worsening , cannot afford GLP1, now for phentermin 30 every day limited rx

## 2024-05-09 NOTE — Assessment & Plan Note (Signed)
 Last vitamin D  Lab Results  Component Value Date   VD25OH 34.03 05/09/2024   Low, to start oral replacement

## 2024-05-09 NOTE — Assessment & Plan Note (Signed)
 Improved after recent flare, to continue allegra 180 every day prn,  to f/u any worsening symptoms or concerns

## 2024-05-10 NOTE — Telephone Encounter (Signed)
 Ok to let pt know, we can schedule Nurse Visit to have these done.  thanks

## 2024-08-12 DIAGNOSIS — K645 Perianal venous thrombosis: Secondary | ICD-10-CM | POA: Diagnosis not present

## 2024-08-12 DIAGNOSIS — K641 Second degree hemorrhoids: Secondary | ICD-10-CM | POA: Diagnosis not present

## 2024-08-12 DIAGNOSIS — K60321 Anal fistula, complex, initial: Secondary | ICD-10-CM | POA: Diagnosis not present
